# Patient Record
Sex: Male | Born: 1945 | Race: White | Hispanic: No | Marital: Married | Smoking: Never smoker
Health system: Southern US, Community
[De-identification: ages and names within clinical notes are randomized; demographics above are authoritative.]

## PROBLEM LIST (undated history)

## (undated) DIAGNOSIS — I10 Essential (primary) hypertension: Secondary | ICD-10-CM

## (undated) DIAGNOSIS — E785 Hyperlipidemia, unspecified: Secondary | ICD-10-CM

## (undated) DIAGNOSIS — M1990 Unspecified osteoarthritis, unspecified site: Secondary | ICD-10-CM

## (undated) DIAGNOSIS — G4730 Sleep apnea, unspecified: Secondary | ICD-10-CM

## (undated) DIAGNOSIS — J45909 Unspecified asthma, uncomplicated: Secondary | ICD-10-CM

## (undated) HISTORY — DX: Unspecified osteoarthritis, unspecified site: M19.90

## (undated) HISTORY — DX: Hyperlipidemia, unspecified: E78.5

## (undated) HISTORY — DX: Sleep apnea, unspecified: G47.30

## (undated) HISTORY — PX: THROAT SURGERY: SHX803

## (undated) HISTORY — DX: Unspecified asthma, uncomplicated: J45.909

## (undated) HISTORY — PX: HERNIA REPAIR: SHX51

## (undated) HISTORY — DX: Essential (primary) hypertension: I10

## (undated) HISTORY — PX: BICEPS TENDON REPAIR: SHX566

---

## 2005-11-19 HISTORY — PX: COLONOSCOPY: SHX174

## 2006-09-19 ENCOUNTER — Ambulatory Visit: Payer: Self-pay | Admitting: Internal Medicine

## 2006-09-26 ENCOUNTER — Ambulatory Visit: Payer: Self-pay | Admitting: Internal Medicine

## 2007-06-10 ENCOUNTER — Ambulatory Visit: Payer: Self-pay | Admitting: Vascular Surgery

## 2011-04-03 NOTE — Procedures (Signed)
CAROTID DUPLEX EXAM   INDICATION:  Syncope.   HISTORY:  Diabetes:  No.  Cardiac:  No.  Hypertension:  Yes.  Smoking:  No.  Previous Surgery:  No.  CV History:  Syncope in July.  Amaurosis Fugax No, Paresthesias No, Hemiparesis No                                       RIGHT             LEFT  Brachial systolic pressure:         160               150  Brachial Doppler waveforms:         Triphasic         Triphasic  Vertebral direction of flow:        Antegrade         Antegrade  DUPLEX VELOCITIES (cm/sec)  CCA peak systolic                   78                91  ECA peak systolic                   90                96  ICA peak systolic                   106               85  ICA end diastolic                   29                24  PLAQUE MORPHOLOGY:                  Mixed             Mixed  PLAQUE AMOUNT:                      Mild              Mild  PLAQUE LOCATION:                    ICA               ICA   IMPRESSION:  The right ICA shows evidence of 1-39% stenosis.  The left ICA shows evidence of 1-19% stenosis.   ___________________________________________  P. Liliane Bade, M.D.   AS/MEDQ  D:  06/10/2007  T:  06/10/2007  Job:  [id number]

## 2012-01-17 ENCOUNTER — Ambulatory Visit (INDEPENDENT_AMBULATORY_CARE_PROVIDER_SITE_OTHER): Payer: 59 | Admitting: Cardiovascular Disease

## 2012-01-17 ENCOUNTER — Encounter: Payer: Self-pay | Admitting: Cardiovascular Disease

## 2012-01-17 VITALS — BP 113/73 | HR 86 | Ht 74.0 in | Wt 197.0 lb

## 2012-01-17 DIAGNOSIS — I4949 Other premature depolarization: Secondary | ICD-10-CM

## 2012-01-17 DIAGNOSIS — M25512 Pain in left shoulder: Secondary | ICD-10-CM | POA: Insufficient documentation

## 2012-01-17 DIAGNOSIS — I493 Ventricular premature depolarization: Secondary | ICD-10-CM

## 2012-01-17 DIAGNOSIS — M25519 Pain in unspecified shoulder: Secondary | ICD-10-CM

## 2012-01-17 NOTE — Patient Instructions (Addendum)
Your physician has requested that you have an exercise tolerance test.. Please also follow instruction sheet, as given.    Your physician wants you to follow-up in: 1 year  You will receive a reminder letter in the mail two months in advance. If you don't receive a letter, please call our office to schedule the follow-up appointment.

## 2012-01-17 NOTE — Assessment & Plan Note (Signed)
Jordan Hicks presents with left shoulder and neck pain. These episodes have typically occurred in the middle of the night when he is lying in bed. They have never occurred with exertion. He denies any exertional shortness of breath, syncope, or presyncope.  He does have some premature ventricular contractions on EKG.  I suspect that he has some cervical disc disease.  I would suggest that he get an MRI of the neck for further evaluation. He may need to see a neurosurgeon or orthopedist if he is found to have significant cervical disc disease.    I think that it is reasonable to do a stress test to rule out coronary artery disease. He is at relatively low risk.

## 2012-01-17 NOTE — Assessment & Plan Note (Signed)
He has premature ventricular contractions noted on his EKG today. I've suggested that he be higher potassium diet.

## 2012-01-17 NOTE — Progress Notes (Signed)
    Teresita Madura Date of Birth  March 12, 1946 [facility]     [place] Office  [address]    [address]   [address] [place], [place]  [address]    [place], [place]  [address] [phone]  Fax  [phone]  [phone]  Fax [phone]  Problems: 1. Shoulder pain. 2. Premature Ventricular contractions  History of Present Illness:  Rupert is a 66 yo who presents today for evaluation of left shoulder pain.  Pain is not exertional. The pain has been going on for 3 days. It typically will occur in the middle of the night around 4 AM. He had a very bad episode last night and then again this morning.  He does not get any regular exercise. Will occasionally go out for walks. He's never had any episodes of chest pain or shortness breath while walking.  He has occasional episodes of orthostasis. He was seen at [facility] several years ago for an episode of orthostasis.  He had what sounds like an echocardiogram which was completely normal. He's not had any severe episodes since that time. He still has occasional mild episodes.   Current Outpatient Prescriptions  Medication Sig Dispense Refill  . aspirin 81 MG tablet Take 81 mg by mouth daily.      . Calcium Carbonate-Vitamin D (CALCIUM + D PO) Take by mouth daily.      Marland Kitchen lisinopril (PRINIVIL,ZESTRIL) 5 MG tablet Take 5 mg by mouth daily.      . Multiple Vitamins-Minerals (MULTIVITAMIN PO) Take by mouth. Taking BID a Week      . niacin 500 MG tablet Take 500 mg by mouth daily with breakfast.        No Known Allergies  Past Medical History  Diagnosis Date  . GERD (gastroesophageal reflux disease)   . Thyroid disease     hypo  . IBS (irritable bowel syndrome)   . DM (diabetes mellitus)     Type 2  . HTN (hypertension)     Past Surgical History  Procedure Date  . Hernia repair   . Throat surgery     About 10-36yrs ago    History  Smoking status  . Never Smoker   Smokeless tobacco  . Not on  file    History  Alcohol Use  . Yes    1 Bottle of Wine a Day   he works at [employer]. He mainly works at [employer].  History reviewed. No pertinent family history.  Reviw of Systems:  Reviewed in the HPI.  All other systems are negative.  Physical Exam: Blood pressure 113/73, pulse 86, weight 197 lb (89.359 kg). General: Well developed, well nourished, in no acute distress.  Head: Normocephalic, atraumatic, sclera non-icteric, mucus membranes are moist,   Neck: Supple. Carotids are 2 + without bruits. No JVD  Lungs: Clear   Heart: regular rate  With normal  S1 S2. No murmurs, gallops or rubs.  Abdomen: Soft, non-tender, non-distended with normal bowel sounds. No hepatomegaly. No rebound/guarding. No masses.  Msk:  Strength and tone are normal  Extremities: No clubbing or cyanosis. No edema.  Distal pedal pulses are 2+ and equal bilaterally.  Neuro: Alert and oriented X 3. Moves all extremities spontaneously.  Psych:  Responds to questions appropriately with a normal affect.  ECG: NSR. occasioinal PVCs.  No St or T wave changes  Assessment / Plan:

## 2012-01-18 ENCOUNTER — Institutional Professional Consult (permissible substitution): Payer: Self-pay | Admitting: Cardiology

## 2012-02-04 ENCOUNTER — Ambulatory Visit (INDEPENDENT_AMBULATORY_CARE_PROVIDER_SITE_OTHER): Payer: 59 | Admitting: Physician Assistant

## 2012-02-04 DIAGNOSIS — I493 Ventricular premature depolarization: Secondary | ICD-10-CM

## 2012-02-04 DIAGNOSIS — M25512 Pain in left shoulder: Secondary | ICD-10-CM

## 2012-02-04 DIAGNOSIS — R002 Palpitations: Secondary | ICD-10-CM

## 2012-02-04 DIAGNOSIS — M25519 Pain in unspecified shoulder: Secondary | ICD-10-CM

## 2012-02-04 NOTE — Progress Notes (Signed)
Exercise Treadmill Test  Pre-Exercise Testing Evaluation Rhythm: normal sinus  Rate: 87   PR:  .14 QRS:  .08  QT:  .37 QTc: .45           Test  Exercise Tolerance Test Ordering MD: Kristeen Miss, MD  Interpreting MD:  Tereso Newcomer, PA-C  Unique Test No: 1  Treadmill:  1  Indication for ETT: chest pain - rule out ischemia  Contraindication to ETT: No   Stress Modality: exercise - treadmill  Cardiac Imaging Performed: non   Protocol: standard Bruce - maximal  Max BP:  184/87  Max MPHR (bpm):  154 85% MPR (bpm):  131  MPHR obtained (bpm):  162 % MPHR obtained:  105%  Reached 85% MPHR (min:sec):  6:44 Total Exercise Time (min-sec):  10:00  Workload in METS:  11.7 Borg Scale: 15  Reason ETT Terminated:  desired heart rate attained    ST Segment Analysis At Rest: normal ST segments - no evidence of significant ST depression With Exercise: no evidence of significant ST depression  Other Information Arrhythmia:  PVCs noted;  one couplet and one triplet noted prior to test but no ventricular arrhythmia with exercise Angina during ETT:  absent (0) Quality of ETT:  diagnostic  ETT Interpretation:  normal - no evidence of ischemia by ST analysis  Comments: Good exercise tolerance. No chest pain. Normal BP response to exercise. No ST-T changes to suggest ischemia.  As noted, PVCs noted in pre-exercise and in recovery. Also, one couplet and one triplet noted prior to exercise.  Recommendations: Follow up with Dr. Delane Ginger as directed. Tereso Newcomer, PA-C  11:31 AM 02/04/2012

## 2012-02-06 ENCOUNTER — Telehealth: Payer: Self-pay | Admitting: *Deleted

## 2012-02-06 DIAGNOSIS — R002 Palpitations: Secondary | ICD-10-CM

## 2012-02-06 NOTE — Telephone Encounter (Signed)
Pt called and informed further testing needed to to palpitations, described echo, pt agreed to plan and is aware we will call him back with date and time.

## 2012-02-21 ENCOUNTER — Ambulatory Visit (HOSPITAL_COMMUNITY): Payer: 59 | Attending: Cardiovascular Disease

## 2012-02-21 ENCOUNTER — Other Ambulatory Visit: Payer: Self-pay

## 2012-02-21 DIAGNOSIS — I4949 Other premature depolarization: Secondary | ICD-10-CM | POA: Insufficient documentation

## 2012-02-21 DIAGNOSIS — I10 Essential (primary) hypertension: Secondary | ICD-10-CM | POA: Insufficient documentation

## 2012-02-21 DIAGNOSIS — R002 Palpitations: Secondary | ICD-10-CM

## 2012-02-21 DIAGNOSIS — E119 Type 2 diabetes mellitus without complications: Secondary | ICD-10-CM | POA: Insufficient documentation

## 2015-08-25 DIAGNOSIS — R69 Illness, unspecified: Secondary | ICD-10-CM | POA: Diagnosis not present

## 2015-12-06 DIAGNOSIS — I10 Essential (primary) hypertension: Secondary | ICD-10-CM | POA: Diagnosis not present

## 2015-12-06 DIAGNOSIS — E785 Hyperlipidemia, unspecified: Secondary | ICD-10-CM | POA: Diagnosis not present

## 2015-12-06 DIAGNOSIS — Z0000 Encounter for general adult medical examination without abnormal findings: Secondary | ICD-10-CM | POA: Diagnosis not present

## 2015-12-14 DIAGNOSIS — Z6825 Body mass index (BMI) 25.0-25.9, adult: Secondary | ICD-10-CM | POA: Diagnosis not present

## 2015-12-14 DIAGNOSIS — M25519 Pain in unspecified shoulder: Secondary | ICD-10-CM | POA: Diagnosis not present

## 2015-12-14 DIAGNOSIS — M7021 Olecranon bursitis, right elbow: Secondary | ICD-10-CM | POA: Diagnosis not present

## 2015-12-14 DIAGNOSIS — I10 Essential (primary) hypertension: Secondary | ICD-10-CM | POA: Diagnosis not present

## 2015-12-14 DIAGNOSIS — Z0000 Encounter for general adult medical examination without abnormal findings: Secondary | ICD-10-CM | POA: Diagnosis not present

## 2015-12-14 DIAGNOSIS — E784 Other hyperlipidemia: Secondary | ICD-10-CM | POA: Diagnosis not present

## 2015-12-14 DIAGNOSIS — J309 Allergic rhinitis, unspecified: Secondary | ICD-10-CM | POA: Diagnosis not present

## 2015-12-14 DIAGNOSIS — Z1389 Encounter for screening for other disorder: Secondary | ICD-10-CM | POA: Diagnosis not present

## 2016-01-04 DIAGNOSIS — L918 Other hypertrophic disorders of the skin: Secondary | ICD-10-CM | POA: Diagnosis not present

## 2016-01-04 DIAGNOSIS — L853 Xerosis cutis: Secondary | ICD-10-CM | POA: Diagnosis not present

## 2016-01-04 DIAGNOSIS — D225 Melanocytic nevi of trunk: Secondary | ICD-10-CM | POA: Diagnosis not present

## 2016-01-04 DIAGNOSIS — L821 Other seborrheic keratosis: Secondary | ICD-10-CM | POA: Diagnosis not present

## 2016-01-04 DIAGNOSIS — L814 Other melanin hyperpigmentation: Secondary | ICD-10-CM | POA: Diagnosis not present

## 2016-02-21 DIAGNOSIS — R69 Illness, unspecified: Secondary | ICD-10-CM | POA: Diagnosis not present

## 2016-02-22 DIAGNOSIS — R69 Illness, unspecified: Secondary | ICD-10-CM | POA: Diagnosis not present

## 2016-03-13 DIAGNOSIS — Z0000 Encounter for general adult medical examination without abnormal findings: Secondary | ICD-10-CM | POA: Diagnosis not present

## 2016-03-13 DIAGNOSIS — E7800 Pure hypercholesterolemia, unspecified: Secondary | ICD-10-CM | POA: Diagnosis not present

## 2016-03-13 DIAGNOSIS — I10 Essential (primary) hypertension: Secondary | ICD-10-CM | POA: Diagnosis not present

## 2016-05-01 DIAGNOSIS — D3709 Neoplasm of uncertain behavior of other specified sites of the oral cavity: Secondary | ICD-10-CM | POA: Diagnosis not present

## 2016-05-04 DIAGNOSIS — K1230 Oral mucositis (ulcerative), unspecified: Secondary | ICD-10-CM | POA: Diagnosis not present

## 2016-05-30 DIAGNOSIS — Z6825 Body mass index (BMI) 25.0-25.9, adult: Secondary | ICD-10-CM | POA: Diagnosis not present

## 2016-05-30 DIAGNOSIS — I10 Essential (primary) hypertension: Secondary | ICD-10-CM | POA: Diagnosis not present

## 2016-05-30 DIAGNOSIS — J301 Allergic rhinitis due to pollen: Secondary | ICD-10-CM | POA: Diagnosis not present

## 2016-05-30 DIAGNOSIS — M7021 Olecranon bursitis, right elbow: Secondary | ICD-10-CM | POA: Diagnosis not present

## 2016-05-30 DIAGNOSIS — E784 Other hyperlipidemia: Secondary | ICD-10-CM | POA: Diagnosis not present

## 2016-05-30 DIAGNOSIS — G250 Essential tremor: Secondary | ICD-10-CM | POA: Diagnosis not present

## 2016-06-15 DIAGNOSIS — H5203 Hypermetropia, bilateral: Secondary | ICD-10-CM | POA: Diagnosis not present

## 2016-06-15 DIAGNOSIS — H43813 Vitreous degeneration, bilateral: Secondary | ICD-10-CM | POA: Diagnosis not present

## 2016-06-15 DIAGNOSIS — H524 Presbyopia: Secondary | ICD-10-CM | POA: Diagnosis not present

## 2016-06-15 DIAGNOSIS — H2513 Age-related nuclear cataract, bilateral: Secondary | ICD-10-CM | POA: Diagnosis not present

## 2016-06-29 ENCOUNTER — Encounter: Payer: Self-pay | Admitting: Internal Medicine

## 2016-07-24 ENCOUNTER — Encounter: Payer: Self-pay | Admitting: Internal Medicine

## 2016-08-04 DIAGNOSIS — Z23 Encounter for immunization: Secondary | ICD-10-CM | POA: Diagnosis not present

## 2016-08-30 DIAGNOSIS — L570 Actinic keratosis: Secondary | ICD-10-CM | POA: Diagnosis not present

## 2016-09-13 DIAGNOSIS — R69 Illness, unspecified: Secondary | ICD-10-CM | POA: Diagnosis not present

## 2016-09-26 ENCOUNTER — Encounter: Payer: Self-pay | Admitting: Internal Medicine

## 2016-11-05 ENCOUNTER — Ambulatory Visit: Payer: Medicare HMO | Admitting: *Deleted

## 2016-11-05 VITALS — Ht 75.0 in | Wt 200.8 lb

## 2016-11-05 DIAGNOSIS — Z1211 Encounter for screening for malignant neoplasm of colon: Secondary | ICD-10-CM

## 2016-11-05 MED ORDER — NA SULFATE-K SULFATE-MG SULF 17.5-3.13-1.6 GM/177ML PO SOLN: 1.0000 | Freq: Once | ORAL | 0 refills | Status: AC

## 2016-11-05 NOTE — Progress Notes (Signed)
Denies allergies to eggs or soy products. Denies complications with sedation or anesthesia. Denies O2 use. Denies use of diet or weight loss medications.  Emmi instructions given for colonoscopy.  

## 2016-11-28 ENCOUNTER — Telehealth: Payer: Self-pay | Admitting: Internal Medicine

## 2016-11-28 NOTE — Telephone Encounter (Signed)
Returned patient's call and he states that he has had cough productive of yellow phlegm and sore throat x 3 days.  Patient states that it has progressively gotten worse over the 3 days. Pt. Denies chills or fever at this time.  He is having a colonoscopy tomorrow and was wanting to know if it would be ok to have it with his cold symptoms. I advised him that I would talk with Dr. Henrene Pastor and see what he advises and call him back with response.  Note routed to Dr. Henrene Pastor.

## 2016-11-28 NOTE — Telephone Encounter (Signed)
Returned patient's call and advised him that as long as he is feeling ok to come in and not running a temperature or symptoms do not worsen, he can proceed with procedure tomorrow.  I advise patient to call if symptoms do worsen and we would reschedule.  Patient agreed to do so.  All questions were answered.

## 2016-11-28 NOTE — Telephone Encounter (Signed)
Mostly common sense. If he feels well enough to proceed, then fine. However, if he would prefer rescheduling to another time, that would be okay certainly

## 2016-11-29 ENCOUNTER — Encounter: Payer: Self-pay | Admitting: Internal Medicine

## 2016-11-29 ENCOUNTER — Ambulatory Visit (AMBULATORY_SURGERY_CENTER): Payer: Medicare HMO | Admitting: Internal Medicine

## 2016-11-29 VITALS — BP 119/77 | HR 69 | Temp 98.7°F | Resp 18 | Ht 75.0 in | Wt 200.0 lb

## 2016-11-29 DIAGNOSIS — D122 Benign neoplasm of ascending colon: Secondary | ICD-10-CM | POA: Diagnosis not present

## 2016-11-29 DIAGNOSIS — Z1211 Encounter for screening for malignant neoplasm of colon: Secondary | ICD-10-CM | POA: Diagnosis not present

## 2016-11-29 DIAGNOSIS — Z1212 Encounter for screening for malignant neoplasm of rectum: Secondary | ICD-10-CM | POA: Diagnosis not present

## 2016-11-29 MED ORDER — SODIUM CHLORIDE 0.9 % IV SOLN: 500.0000 mL | INTRAVENOUS | Status: DC

## 2016-11-29 NOTE — Progress Notes (Signed)
Called to room to assist during endoscopic procedure.  Patient ID and intended procedure confirmed with present staff. Received instructions for my participation in the procedure from the performing physician.  

## 2016-11-29 NOTE — Op Note (Signed)
[place] Patient Name: Jordan Hicks Procedure Date: 11/29/2016 8:49 AM MRN: [id number] Endoscopist: Docia Chuck. Henrene Pastor , MD Age: 72 Referring MD:  Date of Birth: [birth year] Gender: Male Account #: [id number] Procedure:                Colonoscopy w/ cold snare polypectomy x 2 Indications:              Screening for colorectal malignant neoplasm. Prior                            negative exams 2002 and 2007 Medicines:                Monitored Anesthesia Care Procedure:                Pre-Anesthesia Assessment:                           - Prior to the procedure, a History and Physical                            was performed, and patient medications and                            allergies were reviewed. The patient's tolerance of                            previous anesthesia was also reviewed. The risks                            and benefits of the procedure and the sedation                            options and risks were discussed with the patient.                            All questions were answered, and informed consent                            was obtained. Prior Anticoagulants: The patient has                            taken no previous anticoagulant or antiplatelet                            agents. ASA Grade Assessment: II - A patient with                            mild systemic disease. After reviewing the risks                            and benefits, the patient was deemed in                            satisfactory condition to undergo the procedure.  After obtaining informed consent, the colonoscope                            was passed under direct vision. Throughout the                            procedure, the patient's blood pressure, pulse, and                            oxygen saturations were monitored continuously. The                            Colonoscope was introduced through the anus and   advanced to the the cecum, identified by                            appendiceal orifice and ileocecal valve. The                            ileocecal valve, appendiceal orifice, and rectum                            were photographed. The quality of the bowel                            preparation was excellent. The colonoscopy was                            performed without difficulty. The patient tolerated                            the procedure well. The bowel preparation used was                            SUPREP. Scope In: 9:12:37 AM Scope Out: 9:28:14 AM Scope Withdrawal Time: 0 hours 13 minutes 13 seconds  Total Procedure Duration: 0 hours 15 minutes 37 seconds  Findings:                 Two polyps were found in the ascending colon. The                            polyps were 1 to 5 mm in size. These polyps were                            removed with a cold snare. Resection and retrieval                            were complete.                           Multiple small and large-mouthed diverticula were                            found in the mid ascending colon  and left colon.                           Internal hemorrhoids were found during retroflexion.                           The exam was otherwise without abnormality on                            direct and retroflexion views. Complications:            No immediate complications. Estimated blood loss:                            None. Estimated Blood Loss:     Estimated blood loss: none. Impression:               - Two 1 to 5 mm polyps in the ascending colon,                            removed with a cold snare. Resected and retrieved.                           - Diverticulosis in the mid ascending colon and in                            the left colon.                           - Internal hemorrhoids.                           - The examination was otherwise normal on direct                            and retroflexion  views. Recommendation:           - Repeat colonoscopy in 5 years for surveillance.                           - Patient has a contact number available for                            emergencies. The signs and symptoms of potential                            delayed complications were discussed with the                            patient. Return to normal activities tomorrow.                            Written discharge instructions were provided to the                            patient.                           -  Resume previous diet.                           - Continue present medications.                           - Await pathology results. Docia Chuck. Henrene Pastor, MD 11/29/2016 9:34:03 AM This report has been signed electronically.

## 2016-11-29 NOTE — Patient Instructions (Signed)
    Handouts given: Polyps, Hemorrhoids, and Diverticulosis.  YOU HAD AN ENDOSCOPIC PROCEDURE TODAY AT [facility]:   Refer to the procedure report that was given to you for any specific questions about what was found during the examination.  If the procedure report does not answer your questions, please call your gastroenterologist to clarify.  If you requested that your care partner not be given the details of your procedure findings, then the procedure report has been included in a sealed envelope for you to review at your convenience later.  YOU SHOULD EXPECT: Some feelings of bloating in the abdomen. Passage of more gas than usual.  Walking can help get rid of the air that was put into your GI tract during the procedure and reduce the bloating. If you had a lower endoscopy (such as a colonoscopy or flexible sigmoidoscopy) you may notice spotting of blood in your stool or on the toilet paper. If you underwent a bowel prep for your procedure, you may not have a normal bowel movement for a few days.  Please Note:  You might notice some irritation and congestion in your nose or some drainage.  This is from the oxygen used during your procedure.  There is no need for concern and it should clear up in a day or so.  SYMPTOMS TO REPORT IMMEDIATELY:   Following lower endoscopy (colonoscopy or flexible sigmoidoscopy):  Excessive amounts of blood in the stool  Significant tenderness or worsening of abdominal pains  Swelling of the abdomen that is new, acute  Fever of 100F or higher  For urgent or emergent issues, a gastroenterologist can be reached at any hour by calling [phone].   DIET:  We do recommend a small meal at first, but then you may proceed to your regular diet.  Drink plenty of fluids but you should avoid alcoholic beverages for 24 hours.  ACTIVITY:  You should plan to take it easy for the rest of today and you should NOT DRIVE or use heavy machinery until  tomorrow (because of the sedation medicines used during the test).    FOLLOW UP: Our staff will call the number listed on your records the next business day following your procedure to check on you and address any questions or concerns that you may have regarding the information given to you following your procedure. If we do not reach you, we will leave a message.  However, if you are feeling well and you are not experiencing any problems, there is no need to return our call.  We will assume that you have returned to your regular daily activities without incident.  If any biopsies were taken you will be contacted by phone or by letter within the next 1-3 weeks.  Please call us at [phone] if you have not heard about the biopsies in 3 weeks.    SIGNATURES/CONFIDENTIALITY: You and/or your care partner have signed paperwork which will be entered into your electronic medical record.  These signatures attest to the fact that that the information above on your After Visit Summary has been reviewed and is understood.  Full responsibility of the confidentiality of this discharge information lies with you and/or your care-partner.

## 2016-11-29 NOTE — Progress Notes (Signed)
A/ox3 pleased with MAC, report to Karen RN 

## 2016-11-30 ENCOUNTER — Telehealth: Payer: Self-pay

## 2016-11-30 NOTE — Telephone Encounter (Signed)
  Follow up Call-  Call back number 11/29/2016  Post procedure Call Back phone  # [phone]  Permission to leave phone message Yes  Some recent data might be hidden     Patient questions:  Do you have a fever, pain , or abdominal swelling? No. Pain Score  0 *  Have you tolerated food without any problems? Yes.    Have you been able to return to your normal activities? Yes.    Do you have any questions about your discharge instructions: Diet   No. Medications  No. Follow up visit  No.  Do you have questions or concerns about your Care? No.  Actions: * If pain score is 4 or above: No action needed, pain <4.

## 2016-12-06 ENCOUNTER — Encounter: Payer: Self-pay | Admitting: Internal Medicine

## 2016-12-07 DIAGNOSIS — E784 Other hyperlipidemia: Secondary | ICD-10-CM | POA: Diagnosis not present

## 2016-12-07 DIAGNOSIS — R8290 Unspecified abnormal findings in urine: Secondary | ICD-10-CM | POA: Diagnosis not present

## 2016-12-07 DIAGNOSIS — I10 Essential (primary) hypertension: Secondary | ICD-10-CM | POA: Diagnosis not present

## 2016-12-07 DIAGNOSIS — Z125 Encounter for screening for malignant neoplasm of prostate: Secondary | ICD-10-CM | POA: Diagnosis not present

## 2016-12-11 DIAGNOSIS — Z6824 Body mass index (BMI) 24.0-24.9, adult: Secondary | ICD-10-CM | POA: Diagnosis not present

## 2016-12-11 DIAGNOSIS — K640 First degree hemorrhoids: Secondary | ICD-10-CM | POA: Diagnosis not present

## 2016-12-11 DIAGNOSIS — Z0000 Encounter for general adult medical examination without abnormal findings: Secondary | ICD-10-CM | POA: Diagnosis not present

## 2016-12-11 DIAGNOSIS — Z7982 Long term (current) use of aspirin: Secondary | ICD-10-CM | POA: Diagnosis not present

## 2016-12-11 DIAGNOSIS — E7800 Pure hypercholesterolemia, unspecified: Secondary | ICD-10-CM | POA: Diagnosis not present

## 2016-12-11 DIAGNOSIS — M545 Low back pain: Secondary | ICD-10-CM | POA: Diagnosis not present

## 2016-12-11 DIAGNOSIS — I10 Essential (primary) hypertension: Secondary | ICD-10-CM | POA: Diagnosis not present

## 2016-12-11 DIAGNOSIS — Z79899 Other long term (current) drug therapy: Secondary | ICD-10-CM | POA: Diagnosis not present

## 2016-12-11 DIAGNOSIS — R251 Tremor, unspecified: Secondary | ICD-10-CM | POA: Diagnosis not present

## 2016-12-12 DIAGNOSIS — E784 Other hyperlipidemia: Secondary | ICD-10-CM | POA: Diagnosis not present

## 2016-12-12 DIAGNOSIS — Z0000 Encounter for general adult medical examination without abnormal findings: Secondary | ICD-10-CM | POA: Diagnosis not present

## 2016-12-12 DIAGNOSIS — I10 Essential (primary) hypertension: Secondary | ICD-10-CM | POA: Diagnosis not present

## 2016-12-12 DIAGNOSIS — Z6825 Body mass index (BMI) 25.0-25.9, adult: Secondary | ICD-10-CM | POA: Diagnosis not present

## 2016-12-12 DIAGNOSIS — J309 Allergic rhinitis, unspecified: Secondary | ICD-10-CM | POA: Diagnosis not present

## 2016-12-12 DIAGNOSIS — Z1389 Encounter for screening for other disorder: Secondary | ICD-10-CM | POA: Diagnosis not present

## 2016-12-12 DIAGNOSIS — M7021 Olecranon bursitis, right elbow: Secondary | ICD-10-CM | POA: Diagnosis not present

## 2016-12-12 DIAGNOSIS — G250 Essential tremor: Secondary | ICD-10-CM | POA: Diagnosis not present

## 2016-12-26 DIAGNOSIS — Z1212 Encounter for screening for malignant neoplasm of rectum: Secondary | ICD-10-CM | POA: Diagnosis not present

## 2017-01-03 DIAGNOSIS — L738 Other specified follicular disorders: Secondary | ICD-10-CM | POA: Diagnosis not present

## 2017-01-03 DIAGNOSIS — L814 Other melanin hyperpigmentation: Secondary | ICD-10-CM | POA: Diagnosis not present

## 2017-01-03 DIAGNOSIS — D225 Melanocytic nevi of trunk: Secondary | ICD-10-CM | POA: Diagnosis not present

## 2017-01-03 DIAGNOSIS — D1801 Hemangioma of skin and subcutaneous tissue: Secondary | ICD-10-CM | POA: Diagnosis not present

## 2017-01-03 DIAGNOSIS — L308 Other specified dermatitis: Secondary | ICD-10-CM | POA: Diagnosis not present

## 2017-01-03 DIAGNOSIS — L570 Actinic keratosis: Secondary | ICD-10-CM | POA: Diagnosis not present

## 2017-01-03 DIAGNOSIS — D2262 Melanocytic nevi of left upper limb, including shoulder: Secondary | ICD-10-CM | POA: Diagnosis not present

## 2017-01-03 DIAGNOSIS — L821 Other seborrheic keratosis: Secondary | ICD-10-CM | POA: Diagnosis not present

## 2017-03-26 DIAGNOSIS — R69 Illness, unspecified: Secondary | ICD-10-CM | POA: Diagnosis not present

## 2017-04-24 DIAGNOSIS — D692 Other nonthrombocytopenic purpura: Secondary | ICD-10-CM | POA: Diagnosis not present

## 2017-07-03 DIAGNOSIS — L814 Other melanin hyperpigmentation: Secondary | ICD-10-CM | POA: Diagnosis not present

## 2017-07-03 DIAGNOSIS — D0359 Melanoma in situ of other part of trunk: Secondary | ICD-10-CM | POA: Diagnosis not present

## 2017-07-03 DIAGNOSIS — D1801 Hemangioma of skin and subcutaneous tissue: Secondary | ICD-10-CM | POA: Diagnosis not present

## 2017-07-03 DIAGNOSIS — L570 Actinic keratosis: Secondary | ICD-10-CM | POA: Diagnosis not present

## 2017-07-03 DIAGNOSIS — D225 Melanocytic nevi of trunk: Secondary | ICD-10-CM | POA: Diagnosis not present

## 2017-07-30 DIAGNOSIS — D0359 Melanoma in situ of other part of trunk: Secondary | ICD-10-CM | POA: Diagnosis not present

## 2017-08-12 DIAGNOSIS — G250 Essential tremor: Secondary | ICD-10-CM | POA: Diagnosis not present

## 2017-08-12 DIAGNOSIS — I10 Essential (primary) hypertension: Secondary | ICD-10-CM | POA: Diagnosis not present

## 2017-08-12 DIAGNOSIS — Z23 Encounter for immunization: Secondary | ICD-10-CM | POA: Diagnosis not present

## 2017-08-12 DIAGNOSIS — E784 Other hyperlipidemia: Secondary | ICD-10-CM | POA: Diagnosis not present

## 2017-08-12 DIAGNOSIS — Z6824 Body mass index (BMI) 24.0-24.9, adult: Secondary | ICD-10-CM | POA: Diagnosis not present

## 2017-08-12 DIAGNOSIS — M7021 Olecranon bursitis, right elbow: Secondary | ICD-10-CM | POA: Diagnosis not present

## 2017-09-02 DIAGNOSIS — H5203 Hypermetropia, bilateral: Secondary | ICD-10-CM | POA: Diagnosis not present

## 2017-09-02 DIAGNOSIS — H2513 Age-related nuclear cataract, bilateral: Secondary | ICD-10-CM | POA: Diagnosis not present

## 2017-09-02 DIAGNOSIS — H524 Presbyopia: Secondary | ICD-10-CM | POA: Diagnosis not present

## 2017-09-25 DIAGNOSIS — R69 Illness, unspecified: Secondary | ICD-10-CM | POA: Diagnosis not present

## 2017-10-21 DIAGNOSIS — H8112 Benign paroxysmal vertigo, left ear: Secondary | ICD-10-CM | POA: Diagnosis not present

## 2017-10-21 DIAGNOSIS — Z6824 Body mass index (BMI) 24.0-24.9, adult: Secondary | ICD-10-CM | POA: Diagnosis not present

## 2017-12-10 DIAGNOSIS — Z8249 Family history of ischemic heart disease and other diseases of the circulatory system: Secondary | ICD-10-CM | POA: Diagnosis not present

## 2017-12-10 DIAGNOSIS — J309 Allergic rhinitis, unspecified: Secondary | ICD-10-CM | POA: Diagnosis not present

## 2017-12-10 DIAGNOSIS — I10 Essential (primary) hypertension: Secondary | ICD-10-CM | POA: Diagnosis not present

## 2017-12-10 DIAGNOSIS — N529 Male erectile dysfunction, unspecified: Secondary | ICD-10-CM | POA: Diagnosis not present

## 2017-12-10 DIAGNOSIS — Z823 Family history of stroke: Secondary | ICD-10-CM | POA: Diagnosis not present

## 2017-12-10 DIAGNOSIS — Z7982 Long term (current) use of aspirin: Secondary | ICD-10-CM | POA: Diagnosis not present

## 2017-12-12 DIAGNOSIS — R69 Illness, unspecified: Secondary | ICD-10-CM | POA: Diagnosis not present

## 2017-12-13 DIAGNOSIS — H8112 Benign paroxysmal vertigo, left ear: Secondary | ICD-10-CM | POA: Diagnosis not present

## 2017-12-13 DIAGNOSIS — R42 Dizziness and giddiness: Secondary | ICD-10-CM | POA: Diagnosis not present

## 2017-12-13 DIAGNOSIS — Z6824 Body mass index (BMI) 24.0-24.9, adult: Secondary | ICD-10-CM | POA: Diagnosis not present

## 2017-12-17 ENCOUNTER — Encounter: Payer: Self-pay | Admitting: Physical Therapy

## 2017-12-17 ENCOUNTER — Other Ambulatory Visit: Payer: Self-pay

## 2017-12-17 ENCOUNTER — Ambulatory Visit: Payer: Medicare HMO | Attending: Family Medicine | Admitting: Physical Therapy

## 2017-12-17 DIAGNOSIS — H8112 Benign paroxysmal vertigo, left ear: Secondary | ICD-10-CM | POA: Insufficient documentation

## 2017-12-17 DIAGNOSIS — R262 Difficulty in walking, not elsewhere classified: Secondary | ICD-10-CM | POA: Diagnosis not present

## 2017-12-17 DIAGNOSIS — R2681 Unsteadiness on feet: Secondary | ICD-10-CM | POA: Diagnosis not present

## 2017-12-18 NOTE — Therapy (Signed)
[place] [address] [place], [place], [address] Phone: [phone]   Fax:  [phone]  Physical Therapy Evaluation  Patient Details  Name: Jordan Hicks MRN: [id number] Date of Birth: 21-Jul-1946 Referring Provider: Geoffery Lyons NP   Encounter Date: 12/17/2017  PT End of Session - 12/18/17 0535    Visit Number  1    Number of Visits  4   Date for PT Re-Evaluation  01/17/18    Authorization Type  [facility]    Authorization Time Period  12/17/17 to 02/13/18    PT Start Time  1532    PT Stop Time  1608    PT Time Calculation (min)  36 min    Activity Tolerance  Patient tolerated treatment well    Behavior During Therapy  [facility] for tasks assessed/performed       Past Medical History:  Diagnosis Date  . Arthritis    lower back  . Asthma    childhood  . HTN (hypertension)   . Sleep apnea    as stated by wife    Past Surgical History:  Procedure Laterality Date  . BICEPS TENDON REPAIR Left   . COLONOSCOPY  2007  . HERNIA REPAIR    . THROAT SURGERY     About 10-35yrs ago    There were no vitals filed for this visit.   Subjective Assessment - 12/17/17 1536    Subjective  Sometimes it feels like I'm in a bit of a fog. Not thinking straight. I feel spinning for 2-5 seconds when I lie down flat and at times when I sit up from lying down. Describes the Epley maneuver the NP gave him to do (sounds like he has been doing it correctly) however spinning has not resolved for over 10 weeks.     Patient Stated Goals  Stop feeling dizzy    Currently in Pain?  No/denies         [facility] PT Assessment - 12/17/17 1647      Assessment   Medical Diagnosis  lt BPPV    Referring Provider  Geoffery Lyons NP    Onset Date/Surgical Date  10/08/17 approximately    Prior Therapy  none for vertigo      Precautions   Precautions  Fall    Precaution Comments  reports slight sense of imbalance      Restrictions   Weight Bearing Restrictions  No      Balance Screen   Has the patient fallen in the past 6 months  No    Has the patient had a decrease in activity level because of a fear of falling?   No    Is the patient reluctant to leave their home because of a fear of falling?   No      Prior Function   Level of Independence  Independent    Vocation  Retired      Observation/Other Assessments   Focus on Therapeutic Outcomes (FOTO)   NT      Posture/Postural Control   Posture/Postural Control  No significant limitations      Ambulation/Gait   Ambulation/Gait  Yes    Ambulation/Gait Assistance  6: Modified independent (Device/Increase time)    Ambulation Distance (Feet)  400 Feet    Assistive device  None    Gait Pattern  Step-through pattern drifts  left and right with head turns    Ambulation Surface  Indoor    Gait  Comments  after repositioinng gait assessed with +drift with head turns         Vestibular Assessment - 12/17/17 0001      Symptom Behavior   Type of Dizziness  Spinning    Frequency of Dizziness  daily    Duration of Dizziness  seconds    Aggravating Factors  Lying supine;Looking up to the ceiling    Relieving Factors  Slow movements      Occulomotor Exam   Occulomotor Alignment  Normal    Spontaneous  Absent    Gaze-induced  Left beating nystagmus with L gaze 3-4 beats on initial testing; not reproducible    Smooth Pursuits  Intact      Vestibulo-Occular Reflex   VOR 1 Head Only (x 1 viewing)  normal x 30 sec    VOR Cancellation  Normal    Comment  HIT negative bil       Positional Testing   Dix-Hallpike  Dix-Hallpike Right;Dix-Hallpike Left    Horizontal Canal Testing  Horizontal Canal Right;Horizontal Canal Left      Dix-Hallpike Right   Dix-Hallpike Right Duration  0    Dix-Hallpike Right Symptoms  No nystagmus      Dix-Hallpike Left   Dix-Hallpike Left Duration  2 seconds    Dix-Hallpike Left Symptoms  Upbeat, left rotatory nystagmus       Horizontal Canal Right   Horizontal Canal Right Duration  0    Horizontal Canal Right Symptoms  Normal      Horizontal Canal Left   Horizontal Canal Left Duration  0    Horizontal Canal Left Symptoms  Normal      Cognition   Cognition Orientation Level  Oriented x 4         Objective measurements completed on examination: See above findings.              PT Education - 12/18/17 0534    Education provided  Yes    Education Details  mechanism of BPPV and purpose of cannalith repositioning    Person(s) Educated  Patient    Methods  Explanation    Comprehension  Verbalized understanding          PT Long Term Goals - 12/18/17 0544      PT LONG TERM GOAL #1   Title  Patient will have negative positional testing for BPPV (Target 01/16/18)    Time  4    Period  Weeks    Status  New    Target Date  01/16/18      PT LONG TERM GOAL #2   Title  Patient will ambulate with head turns in all directions with <6 inch sway off path.     Time  4    Period  Weeks    Status  New             Plan - 12/18/17 0536    Clinical Impression Statement  Patient referred to PT for evaluation of vertigo lasting ~ 10 weeks despite NP performing cannalith repositioning, treating pt to do cannalith repositioing, use of meclizine, and hydration. Pt with +left Hallpike-Dix and responded to 3 rounds of Epley maneuver with no symptoms or nystagmus on 3rd cycle. No signs of hypofunction present at this time. Anticipate patient is cleared of BPPV, however will schedule one additional appointment for further repositioning (if needed) due to refractory nature of his BPPV.     Clinical Presentation  Stable    Clinical Decision  Making  Low    Rehab Potential  Excellent    PT Frequency  1x / week    PT Duration  4 weeks    PT Treatment/Interventions  ADLs/Self Care Home Management;Canalith Repostioning;Gait training;Therapeutic activities;Therapeutic exercise;Balance training;Neuromuscular  re-education;Patient/family education;Vestibular    PT Next Visit Plan  reassess for lt pBPPV and treat    Consulted and Agree with Plan of Care  Patient       Patient will benefit from skilled therapeutic intervention in order to improve the following deficits and impairments:  Decreased activity tolerance, Decreased balance, Dizziness, Impaired vision/preception  Visit Diagnosis: BPPV (benign paroxysmal positional vertigo), left - Plan: PT plan of care cert/re-cert  Unsteadiness on feet - Plan: PT plan of care cert/re-cert  Difficulty in walking, not elsewhere classified - Plan: PT plan of care cert/re-cert     Problem List Patient Active Problem List   Diagnosis Date Noted  . Left shoulder pain 01/17/2012  . Premature ventricular contractions 01/17/2012    Rexanne Mano, PT 12/18/2017, 5:52 AM  [facility] [address] [place], [place], [address] Phone: [phone]   Fax:  [phone]  Name: Jordan Hicks MRN: [id number] Date of Birth: 1945-12-14

## 2017-12-27 ENCOUNTER — Encounter: Payer: Self-pay | Admitting: Physical Therapy

## 2017-12-27 ENCOUNTER — Ambulatory Visit: Payer: Medicare HMO | Attending: Family Medicine | Admitting: Physical Therapy

## 2017-12-27 DIAGNOSIS — H8112 Benign paroxysmal vertigo, left ear: Secondary | ICD-10-CM | POA: Insufficient documentation

## 2017-12-27 DIAGNOSIS — R2681 Unsteadiness on feet: Secondary | ICD-10-CM | POA: Insufficient documentation

## 2017-12-27 NOTE — Therapy (Signed)
[place] [facility] [address] [place], [place], [address] Phone: [phone]   Fax:  [phone]  Physical Therapy Treatment  Patient Details  Name: Jordan Hicks MRN: [id number] Date of Birth: September 13, 1946 Referring Provider: Geoffery Lyons NP   Encounter Date: 12/27/2017  PT End of Session - 12/27/17 1856    Visit Number  2    Number of Visits  5 corrected to match certification of 1x/wk x 4 weeks    Date for PT Re-Evaluation  01/17/18    Authorization Type  [facility]    Authorization Time Period  12/17/17 to 02/13/18    PT Start Time  0803    PT Stop Time  0826    PT Time Calculation (min)  23 min    Activity Tolerance  Patient tolerated treatment well    Behavior During Therapy  [facility] for tasks assessed/performed       Past Medical History:  Diagnosis Date  . Arthritis    lower back  . Asthma    childhood  . HTN (hypertension)   . Sleep apnea    as stated by wife    Past Surgical History:  Procedure Laterality Date  . BICEPS TENDON REPAIR Left   . COLONOSCOPY  2007  . HERNIA REPAIR    . THROAT SURGERY     About 10-20yrs ago    There were no vitals filed for this visit.  Subjective Assessment - 12/27/17 0804    Subjective  Was good for about 5 days. Wasn't completely gone but significantly less. Then laid down on massage table yesterday and spinning again. Not the severity he had earlier.     Patient Stated Goals  Stop feeling dizzy    Currently in Pain?  No/denies             Vestibular Assessment - 12/27/17 0838      Positional Testing   Sidelying Test  Sidelying Right      Dix-Hallpike Left   Dix-Hallpike Left Duration  10    Dix-Hallpike Left Symptoms  Upbeat, left rotatory nystagmus      Sidelying Right   Sidelying Right Duration  0    Sidelying Right Symptoms  No nystagmus              [facility] Adult PT Treatment/Exercise - 12/27/17 0828      Bed Mobility   Bed Mobility   Rolling Right;Rolling Left;Right Sidelying to Sit;Sit to Supine    Rolling Right  7: Independent no symptoms with any bed mobility after Epley x 2    Rolling Left  7: Independent    Right Sidelying to Sit  7: Independent    Sit to Supine  7: Independent      Transfers   Transfers  Sit to Stand;Stand to Sit    Sit to Stand  7: Independent no symptoms      Ambulation/Gait   Ambulation/Gait  Yes    Ambulation/Gait Assistance  7: Independent    Ambulation Distance (Feet)  200 Feet    Assistive device  None    Gait Pattern  Within Functional Limits    Ambulation Surface  Indoor    Stairs  Yes    Stairs Assistance  7: Independent    Stair Management Technique  No rails;Alternating pattern;Forwards    Number of Stairs  4    Height of Stairs  6    Gait Comments  with head turns asymptomatic; no  symptoms going down stairs      Vestibular Treatment/Exercise - 12/27/17 0001      Vestibular Treatment/Exercise   Vestibular Treatment Provided  Canalith Repositioning    Canalith Repositioning  Epley Manuever Left       EPLEY MANUEVER LEFT   Number of Reps   2    Overall Response   Symptoms Resolved     RESPONSE DETAILS LEFT  +symptoms x 2 steps; no symptoms on 2nd complete rep            PT Education - 12/27/17 0831    Education provided  Yes    Education Details  hydration can help; Brandt-Daroff and rationale; keep case open x 60 days if recurrence    Person(s) Educated  Patient    Methods  Explanation;Demonstration;Handout    Comprehension  Verbalized understanding;Returned demonstration          PT Long Term Goals - 12/18/17 0544      PT LONG TERM GOAL #1   Title  Patient will have negative positional testing for BPPV (Target 01/16/18)    Time  4    Period  Weeks    Status  New    Target Date  01/16/18      PT LONG TERM GOAL #2   Title  Patient will ambulate with head turns in all directions with <6 inch sway off path.     Time  4    Period  Weeks    Status   New            Plan - 12/27/17 7824    Clinical Impression Statement  Patient with recurrence of symptoms when getting massage 12/26/17. Again +Hallpike-Dix on lt; negative sidelying test on right. Symptoms cleared and education completed. Will keep case open x 60 days in case of recurrence.     Rehab Potential  Excellent    PT Frequency  1x / week    PT Duration  4 weeks    PT Treatment/Interventions  ADLs/Self Care Home Management;Canalith Repostioning;Gait training;Therapeutic activities;Therapeutic exercise;Balance training;Neuromuscular re-education;Patient/family education;Vestibular    PT Next Visit Plan  reassess for lt pBPPV and treat; ? recert to cover visit    Consulted and Agree with Plan of Care  Patient       Patient will benefit from skilled therapeutic intervention in order to improve the following deficits and impairments:  Decreased activity tolerance, Decreased balance, Dizziness, Impaired vision/preception  Visit Diagnosis: BPPV (benign paroxysmal positional vertigo), left     Problem List Patient Active Problem List   Diagnosis Date Noted  . Left shoulder pain 01/17/2012  . Premature ventricular contractions 01/17/2012    Rexanne Mano, PT 12/27/2017, 8:38 AM  [facility] [address] [place], [place], [address] Phone: [phone]   Fax:  [phone]  Name: ASHELY JOSHUA MRN: [id number] Date of Birth: 10-08-1946

## 2017-12-27 NOTE — Patient Instructions (Signed)
Provided with Brandt-Daroff exercises; instructed to do for up to 2 days and if remains asymptomatic, can discontinue

## 2018-01-03 ENCOUNTER — Ambulatory Visit: Payer: Medicare HMO | Admitting: Physical Therapy

## 2018-01-03 ENCOUNTER — Encounter: Payer: Self-pay | Admitting: Physical Therapy

## 2018-01-03 VITALS — BP 118/69 | HR 76

## 2018-01-03 DIAGNOSIS — H8112 Benign paroxysmal vertigo, left ear: Secondary | ICD-10-CM | POA: Diagnosis not present

## 2018-01-03 DIAGNOSIS — R2681 Unsteadiness on feet: Secondary | ICD-10-CM | POA: Diagnosis not present

## 2018-01-03 NOTE — Therapy (Signed)
[place] [address] [place], [place], [address] Phone: [phone]   Fax:  [phone]  Physical Therapy Treatment  Patient Details  Name: Jordan Hicks MRN: [id number] Date of Birth: [birth year] Referring Provider: Geoffery Lyons NP   Encounter Date: 01/03/2018  PT End of Session - 01/03/18 0919    Visit Number  3    Number of Visits  5    Date for PT Re-Evaluation  01/17/18    Authorization Type  [facility] [facility]    Authorization Time Period  12/17/17 to 02/13/18    PT Start Time  0845    PT Stop Time  0900 session ended early - no vertigo today    PT Time Calculation (min)  15 min       Past Medical History:  Diagnosis Date  . Arthritis    lower back  . Asthma    childhood  . HTN (hypertension)   . Sleep apnea    as stated by wife    Past Surgical History:  Procedure Laterality Date  . BICEPS TENDON REPAIR Left   . COLONOSCOPY  2007  . HERNIA REPAIR    . THROAT SURGERY     About 10-62yrs ago    Vitals:   01/03/18 0853  BP: 118/69  Pulse: 76    Subjective Assessment - 01/03/18 0903    Subjective  Pt states he has not had vertigo for past 3 days; states he felt good last weekend after treatment on Friday (2-8) and then states vertigo returned the first part of the week for aobut 2-3 days; states he feels pretty good today; states he quit doing the exercises (sit to sidelying) because they made him so dizzy ; says he started to cancel today but wanted to confirm that the vertigo was resolved; has appt with Dr. Jaynee Eagles on Monday                                                                                                                 Patient Stated Goals  Stop feeling dizzy    Currently in Pain?  No/denies         NeuroRe-ed;   Positional testing for BPPV  Pt had (-) Rt and Lt Dix-Hallpike test with no nystagmus and no c/o vertigo in test position  (-) Rt and Lt sidelying tests with no  nystagmus and no c/o vertigo in test positions  Pt transferred sit to supine with 2 small pillows used with no c/o vertigo today - pt states this position Has provoked vertigo in the past  Pt requested to schedule appt with primary PT next Friday to discuss status after appt with Dr. Jaynee Eagles on 01-06-18    BP recorded - 118/69 after positional testing - pt had no c/o vertigo                  PT Education - 01/03/18 0927    Education provided  Yes    Education Details  reitterated benefit of hydration; discussed etiology of BPPV    Person(s) Educated  Patient    Methods  Explanation    Comprehension  Verbalized understanding          PT Long Term Goals - 12/18/17 0544      PT LONG TERM GOAL #1   Title  Patient will have negative positional testing for BPPV (Target 01/16/18)    Time  4    Period  Weeks    Status  New    Target Date  01/16/18      PT LONG TERM GOAL #2   Title  Patient will ambulate with head turns in all directions with <6 inch sway off path.     Time  4    Period  Weeks    Status  New            Plan - 01/03/18 7026    Clinical Impression Statement  Pt had no signs or symptoms of BPPV or any vertigo with any positional testing at this time. He has neurololgy appt on Monday, 01-06-18 with Dr. Jaynee Eagles; BPPV appears to be resolved at this time    Rehab Potential  Excellent    PT Frequency  1x / week    PT Duration  4 weeks    PT Treatment/Interventions  ADLs/Self Care Home Management;Canalith Repostioning;Gait training;Therapeutic activities;Therapeutic exercise;Balance training;Neuromuscular re-education;Patient/family education;Vestibular    PT Next Visit Plan  reassess for lt BPPV and treat; ? recert to cover visit    Consulted and Agree with Plan of Care  Patient       Patient will benefit from skilled therapeutic intervention in order to improve the following deficits and impairments:  Decreased activity tolerance, Decreased balance,  Dizziness, Impaired vision/preception  Visit Diagnosis: BPPV (benign paroxysmal positional vertigo), left     Problem List Patient Active Problem List   Diagnosis Date Noted  . Left shoulder pain 01/17/2012  . Premature ventricular contractions 01/17/2012    Louiza Moor, [place], PT 01/03/2018, 9:30 AM  [facility] [address] [place], [place], [address] Phone: [phone]   Fax:  [phone]  Name: DEAARON FULGHUM MRN: [id number] Date of Birth: 1945/12/30

## 2018-01-06 ENCOUNTER — Ambulatory Visit: Payer: Medicare HMO | Admitting: Neurology

## 2018-01-06 ENCOUNTER — Encounter: Payer: Self-pay | Admitting: Neurology

## 2018-01-06 VITALS — Ht 75.0 in | Wt 191.0 lb

## 2018-01-06 DIAGNOSIS — A881 Epidemic vertigo: Secondary | ICD-10-CM

## 2018-01-06 DIAGNOSIS — H905 Unspecified sensorineural hearing loss: Secondary | ICD-10-CM | POA: Diagnosis not present

## 2018-01-06 DIAGNOSIS — R42 Dizziness and giddiness: Secondary | ICD-10-CM | POA: Diagnosis not present

## 2018-01-06 DIAGNOSIS — H9190 Unspecified hearing loss, unspecified ear: Secondary | ICD-10-CM

## 2018-01-06 MED ORDER — METHYLPREDNISOLONE 4 MG PO TBPK: ORAL_TABLET | ORAL | 1 refills | Status: DC

## 2018-01-06 NOTE — Progress Notes (Addendum)
[id number]    Provider:  Dr Jaynee Eagles Referring Provider: Burnard Bunting, MD Primary Care Physician:  Burnard Bunting, MD  CC:  Vertigo.   HPI:  Jordan Hicks is a 72 y.o. male here as a referral from Dr. Reynaldo Minium for vertigo.  Past medical history arthritis, asthma, hypertension, sleep apnea(per wife),  migraine. He is here for vertigo. He had been doing well in vestibular therapy. His episodes of dizziness, more when he stands up, has improved over recent weeks. Also when he looks down. Also happens when he lays down and looks up at the ceiling. He is symptomatic with the Epley maneuvers and stopped doing them. Vertigo started in November, no inciting events, nothing out of the ordinary, no head trauma, never experienced vertigo before. Episodes last a few minutes. Overall he is improving. Don;t wake up with headaches, no dry mouth in the morning, feel well rested. He drinks a lot wine, 1-1.5 bottles a day denies loss of memory, just foggy. No other focal neurologic deficits, associated symptoms, inciting events or modifiable factors.  Reviewed notes, labs and imaging from outside physicians, which showed:  Reviewed referring physician notes.  Patient has been having dizziness and vertigo.  Vestibular rehab is helping but the dizziness returns.  Patient says that he feels as though he is in a fog.  Not thinking straight.  He feels spinning for 2-5 seconds when he lies down flat and at times when he sits up from lying down.  Appears to be doing the Epley maneuver, spinning has not resolved for over 10 weeks.  Reports slight sense of imbalance.  The spinning is daily, last seconds, left beating nystagmus with left gaze.  He is use meclizine.  Positive left Dix-Hallpike and responded to 3 rounds of Epley maneuver with no symptoms and no nystagmus on the third cycle. Neuro exam unremarkable.   Reviewed lab work, BMP with BUN 10 and creatinine 0.9. 12/13/2017  Review of  Systems: Patient complains of symptoms per HPI as well as the following symptoms: Blurred vision, double vision, shortness of breath, snoring, constipation, allergies. Pertinent negatives and positives per HPI. All others negative.   Social History   Socioeconomic History  . Marital status: Married    Spouse name: Not on file  . Number of children: 1  . Years of education: Not on file  . Highest education level: Bachelor's degree (e.g., BA, AB, BS)  Social Needs  . Financial resource strain: Not on file  . Food insecurity - worry: Not on file  . Food insecurity - inability: Not on file  . Transportation needs - medical: Not on file  . Transportation needs - non-medical: Not on file  Occupational History  . Not on file  Tobacco Use  . Smoking status: Never Smoker  . Smokeless tobacco: Never Used  Substance and Sexual Activity  . Alcohol use: Yes    Comment: 1 Bottle of Wine a Day  . Drug use: No  . Sexual activity: Not on file  Other Topics Concern  . Not on file  Social History Narrative   Lives at home with his wife   Right handed   Drinks 3 cups of caffeine per week    Family History  Problem Relation Age of Onset  . Emphysema Mother   . Heart attack Father   . Colon cancer Neg Hx     Past Medical History:  Diagnosis Date  . Arthritis    lower back  . Asthma  childhood  . HTN (hypertension)   . Hyperlipidemia   . Sleep apnea    as stated by wife    Past Surgical History:  Procedure Laterality Date  . BICEPS TENDON REPAIR Left   . COLONOSCOPY  2007  . HERNIA REPAIR    . THROAT SURGERY     About 10-64yrs ago    Current Outpatient Medications  Medication Sig Dispense Refill  . aspirin 81 MG tablet Take 81 mg by mouth daily.    . DHA-EPA-Vitamin E (OMEGA-3 COMPLEX PO) Take by mouth 2 (two) times daily.    . Multiple Vitamins-Minerals (MULTIVITAMIN PO) Take by mouth. Taking about 4 times weekly    . niacin 500 MG tablet Take 500 mg by mouth daily  with breakfast.    . NON FORMULARY 2 tablets daily. Cellular vitality complex     . VALSARTAN PO Take by mouth. Per pt equivalent of Losartan 50 mg. Had to change due to recall on Losartan.    . methylPREDNISolone (MEDROL DOSEPAK) 4 MG TBPK tablet follow package directions 21 tablet 1   Current Facility-Administered Medications  Medication Dose Route Frequency Provider Last Rate Last Dose  . 0.9 %  sodium chloride infusion  500 mL Intravenous Continuous Irene Shipper, MD        Allergies as of 01/06/2018  . (No Known Allergies)    Vitals: Ht 6\' 3"  (1.905 m)   Wt 191 lb (86.6 kg)   BMI 23.87 kg/m  Last Weight:  Wt Readings from Last 1 Encounters:  01/06/18 191 lb (86.6 kg)   Last Height:   Ht Readings from Last 1 Encounters:  01/06/18 6\' 3"  (1.905 m)   Physical exam: Exam: Gen: NAD, conversant, well nourised, obese, well groomed                     CV: RRR, no MRG. No Carotid Bruits. No peripheral edema, warm, nontender Eyes: Conjunctivae clear without exudates or hemorrhage  Neuro: Detailed Neurologic Exam  Speech:    Speech is normal; fluent and spontaneous with normal comprehension.  Cognition:    The patient is oriented to person, place, and time;     recent and remote memory intact;     language fluent;     normal attention, concentration,     fund of knowledge Cranial Nerves:    The pupils are equal, round, and reactive to light. The fundi are normal and spontaneous venous pulsations are present. Visual fields are full to finger confrontation. End-gaze nystagmus on left. Extraocular movements are intact. Trigeminal sensation is intact and the muscles of mastication are normal. The face is symmetric. The palate elevates in the midline. Hearing intact. Voice is normal. Shoulder shrug is normal. The tongue has normal motion without fasciculations.   Coordination:    Normal finger to nose and heel to shin. Normal rapid alternating movements.   Gait:    Heel-toe and  tandem gait are normal.   Motor Observation:    No asymmetry, no atrophy, and no involuntary movements noted. Tone:    Normal muscle tone.    Posture:    Posture is normal. normal erect    Strength:    Strength is V/V in the upper and lower limbs.      Sensation: intact to LT     Reflex Exam:  DTR's:    Deep tendon reflexes in the upper and lower extremities are normal bilaterally.   Toes:    The  toes are downgoing bilaterally.   Clonus:    Clonus is absent.       Assessment/Plan:  72 year old with likely BPPV. Ongoing several months, 10 weeks of vestibular therapy still symptomatic. Need MRI brain, referral to ENT and will order steroids just in case labyrinthitis.  - Per PT notes,  Positive left Dix-Hallpike and responded to 3 rounds of Epley maneuver with no symptoms and no nystagmus on the third cycle.  - BPPV still persistent after 10 weeks, having vestibular therapy  - Wife has stated she thinks he has sleep apnea, he denies memory loss, he feels well rested. Discuss with wife and if apneic spells needs sleep eval.  - 1-1.5 bottles of wine, discussed sequelae of this much alcohol, discuss with pcp (do not stop abruptly)  - ENT referral, Dr. Ernesto Rutherford   - MRI brain w/wo contrast to evaluate for schwannoma or other etiology of vertigo and hearing changes  - fall precautions  - continue vestibular therapy  Orders Placed This Encounter  Procedures  . MR BRAIN W WO CONTRAST  . Basic Metabolic Panel  . Ambulatory referral to ENT      Sarina Ill, MD  [facility] [address] [place], [place] [address]  Phone [phone] Fax [phone]

## 2018-01-06 NOTE — Patient Instructions (Addendum)
MRI of the brain with contrast Steroids for 6 days ENT referral Dr. Ernesto Rutherford   Labyrinthitis Labyrinthitis is an infection of the inner ear. Your inner ear is a fluid-filled system of tubes and canals (labyrinth). Nerve cells in your inner ear send signals for hearing and balance to your brain. When tiny germs (microorganisms) get inside the labyrinth, they harm the cells that send messages to the brain. Labyrinthitis can cause changes in hearing and balance. Most cases of labyrinthitis come on suddenly and they clear up within weeks. If the infection damages parts of the labyrinth, some symptoms may remain (chronic labyrinthitis). What are the causes? Viruses are the most common cause of labyrinthitis. Viruses that spread into the labyrinth are the same viruses that cause other diseases, such as:  Mononucleosis.  Measles.  Flu.  Herpes.  Bacteria can also cause labyrinthitis when they spread into the labyrinth from an infection in the brain or the middle ear. Bacteria can cause:  Serous labyrinthitis. This type of labyrinthitis develops when bacteria produce a poison (toxin) that gets inside the labyrinth.  Suppurative labyrinthitis. This type of labyrinthitis develops when bacteria get inside the labyrinth.  What increases the risk? You may be at greater risk for labyrinthitis if you:  Drink a lot of alcohol.  Smoke.  Take certain drugs.  Are not well rested (fatigued).  Are under a lot of stress.  Have allergies.  Recently had a nose or throat infection (upper respiratory infection) or an ear infection.  What are the signs or symptoms? Symptoms of labyrinthitis usually start suddenly. The symptoms can be mild or strong and may include:  Dizziness.  Hearing loss.  A feeling that you are moving when you are not (vertigo).  Ringing in the ear (tinnitus).  Nausea and vomiting.  Trouble focusing your eyes.  Symptoms of chronic labyrinthitis may  include:  Fatigue.  Confusion.  Hearing loss.  Tinnitus.  Poor balance.  Vertigo after sudden head movements.  How is this diagnosed? Your health care provider may suspect labyrinthitis if you suddenly get dizzy and lose hearing, especially if you had a recent upper respiratory infection. Your health care provider will perform a physical exam to:  Check your ears for infection.  Test your balance.  Check your eye movement.  Your health care provider may do several tests to rule out other causes of your symptoms and to help make a diagnosis of labyrinthitis. These may include:  Imaging studies, such as a CT scan or an MRI, to look for other causes of your symptoms.  Hearing tests.  Electronystagmography (ENG) to check your balance.  How is this treated? Treatment of labyrinthitis depends on the cause. If your labyrinthitis is caused by a virus, it may get better without treatment. If your labyrinthitis is caused by bacteria, you may need medicine to fight the infection (antibiotic medicine). You may also have treatment to relieve labyrinthitis symptoms. Treatments may include:  Medicines to: ? Stop dizziness. ? Relieve nausea. ? Treat the inflamed area. ? Speed up your recovery.  Bed rest until dizziness goes away.  Fluids given through an IV tube. You may need this treatment if you have too little fluid in your body (dehydrated) from repeated nausea and vomiting.  Follow these instructions at home:  Take medicines only as directed by your health care provider.  If you were prescribed an antibiotic medicine, finish all of it even if you start to feel better.  Rest as much as possible.  Avoid loud noises and bright lights.  Do not make sudden movements until any dizziness goes away.  Do not drive until your health care provider says that you can.  Drink enough fluid to keep your urine clear or pale yellow.  Work with a physical therapist if you still feel  dizzy after several weeks. A therapist can teach you exercises to help you adjust to feeling dizzy (vestibular rehabilitation exercises).  Keep all follow-up visits as directed by your health care provider. This is important. Contact a health care provider if:  Your symptoms are not relieved by medicines.  Your symptoms last longer than two weeks.  You have a fever. Get help right away if:  You become very dizzy.  You have nausea or vomiting that does not go away.  Your hearing gets much worse very quickly. This information is not intended to replace advice given to you by your health care provider. Make sure you discuss any questions you have with your health care provider. Document Released: 11/26/2014 Document Revised: 04/12/2016 Document Reviewed: 07/07/2014 Elsevier Interactive Patient Education  2018 [employer].      Vertigo Vertigo is the feeling that you or your surroundings are moving when they are not. Vertigo can be dangerous if it occurs while you are doing something that could endanger you or others, such as driving. What are the causes? This condition is caused by a disturbance in the signals that are sent by your body's sensory systems to your brain. Different causes of a disturbance can lead to vertigo, including:  Infections, especially in the inner ear.  A bad reaction to a drug, or misuse of alcohol and medicines.  Withdrawal from drugs or alcohol.  Quickly changing positions, as when lying down or rolling over in bed.  Migraine headaches.  Decreased blood flow to the brain.  Decreased blood pressure.  Increased pressure in the brain from a head or neck injury, stroke, infection, tumor, or bleeding.  Central nervous system disorders.  What are the signs or symptoms? Symptoms of this condition usually occur when you move your head or your eyes in different directions. Symptoms may start suddenly, and they usually last for less than a minute.  Symptoms may include:  Loss of balance and falling.  Feeling like you are spinning or moving.  Feeling like your surroundings are spinning or moving.  Nausea and vomiting.  Blurred vision or double vision.  Difficulty hearing.  Slurred speech.  Dizziness.  Involuntary eye movement (nystagmus).  Symptoms can be mild and cause only slight annoyance, or they can be severe and interfere with daily life. Episodes of vertigo may return (recur) over time, and they are often triggered by certain movements. Symptoms may improve over time. How is this diagnosed? This condition may be diagnosed based on medical history and the quality of your nystagmus. Your health care provider may test your eye movements by asking you to quickly change positions to trigger the nystagmus. This may be called the Dix-Hallpike test, head thrust test, or roll test. You may be referred to a health care provider who specializes in ear, nose, and throat (ENT) problems (otolaryngologist) or a provider who specializes in disorders of the central nervous system (neurologist). You may have additional testing, including:  A physical exam.  Blood tests.  MRI.  A CT scan.  An electrocardiogram (ECG). This records electrical activity in your heart.  An electroencephalogram (EEG). This records electrical activity in your brain.  Hearing tests.  How  is this treated? Treatment for this condition depends on the cause and the severity of the symptoms. Treatment options include:  Medicines to treat nausea or vertigo. These are usually used for severe cases. Some medicines that are used to treat other conditions may also reduce or eliminate vertigo symptoms. These include: ? Medicines that control allergies (antihistamines). ? Medicines that control seizures (anticonvulsants). ? Medicines that relieve depression (antidepressants). ? Medicines that relieve anxiety (sedatives).  Head movements to adjust your inner ear  back to normal. If your vertigo is caused by an ear problem, your health care provider may recommend certain movements to correct the problem.  Surgery. This is rare.  Follow these instructions at home: Safety  Move slowly.Avoid sudden body or head movements.  Avoid driving.  Avoid operating heavy machinery.  Avoid doing any tasks that would cause danger to you or others if you would have a vertigo episode during the task.  If you have trouble walking or keeping your balance, try using a cane for stability. If you feel dizzy or unstable, sit down right away.  Return to your normal activities as told by your health care provider. Ask your health care provider what activities are safe for you. General instructions  Take over-the-counter and prescription medicines only as told by your health care provider.  Avoid certain positions or movements as told by your health care provider.  Drink enough fluid to keep your urine clear or pale yellow.  Keep all follow-up visits as told by your health care provider. This is important. Contact a health care provider if:  Your medicines do not relieve your vertigo or they make it worse.  You have a fever.  Your condition gets worse or you develop new symptoms.  Your family or friends notice any behavioral changes.  Your nausea or vomiting gets worse.  You have numbness or a "pins and needles" sensation in part of your body. Get help right away if:  You have difficulty moving or speaking.  You are always dizzy.  You faint.  You develop severe headaches.  You have weakness in your hands, arms, or legs.  You have changes in your hearing or vision.  You develop a stiff neck.  You develop sensitivity to light. This information is not intended to replace advice given to you by your health care provider. Make sure you discuss any questions you have with your health care provider. Document Released: 08/15/2005 Document Revised:  04/18/2016 Document Reviewed: 02/28/2015 Elsevier Interactive Patient Education  [employer].

## 2018-01-06 NOTE — Addendum Note (Signed)
Addended by: Sarina Ill B on: 01/06/2018 09:24 AM   Modules accepted: Orders

## 2018-01-08 DIAGNOSIS — D485 Neoplasm of uncertain behavior of skin: Secondary | ICD-10-CM | POA: Diagnosis not present

## 2018-01-08 DIAGNOSIS — D2262 Melanocytic nevi of left upper limb, including shoulder: Secondary | ICD-10-CM | POA: Diagnosis not present

## 2018-01-08 DIAGNOSIS — L570 Actinic keratosis: Secondary | ICD-10-CM | POA: Diagnosis not present

## 2018-01-08 DIAGNOSIS — D225 Melanocytic nevi of trunk: Secondary | ICD-10-CM | POA: Diagnosis not present

## 2018-01-08 DIAGNOSIS — L308 Other specified dermatitis: Secondary | ICD-10-CM | POA: Diagnosis not present

## 2018-01-08 DIAGNOSIS — Z85820 Personal history of malignant melanoma of skin: Secondary | ICD-10-CM | POA: Diagnosis not present

## 2018-01-08 DIAGNOSIS — D1801 Hemangioma of skin and subcutaneous tissue: Secondary | ICD-10-CM | POA: Diagnosis not present

## 2018-01-08 DIAGNOSIS — L821 Other seborrheic keratosis: Secondary | ICD-10-CM | POA: Diagnosis not present

## 2018-01-10 ENCOUNTER — Ambulatory Visit: Payer: Medicare HMO | Admitting: Physical Therapy

## 2018-01-10 ENCOUNTER — Encounter: Payer: Self-pay | Admitting: Physical Therapy

## 2018-01-10 DIAGNOSIS — R2681 Unsteadiness on feet: Secondary | ICD-10-CM

## 2018-01-10 DIAGNOSIS — H8112 Benign paroxysmal vertigo, left ear: Secondary | ICD-10-CM | POA: Diagnosis not present

## 2018-01-10 NOTE — Therapy (Signed)
[facility] [address] [place], [place], [address] Phone: [phone]   Fax:  [phone]  Physical Therapy Treatment  Patient Details  Name: Jordan Hicks MRN: [id number] Date of Birth: 04-Jun-1946 Referring Provider: Geoffery Lyons NP   Encounter Date: 01/10/2018  PT End of Session - 01/10/18 2014    Visit Number  4    Number of Visits  5    Date for PT Re-Evaluation  01/17/18    Authorization Type  [employer] [facility]    Authorization Time Period  12/17/17 to 02/13/18    PT Start Time  2595    PT Stop Time  1445    PT Time Calculation (min)  42 min    Activity Tolerance  Patient tolerated treatment well    Behavior During Therapy  [facility] for tasks assessed/performed       Past Medical History:  Diagnosis Date  . Arthritis    lower back  . Asthma    childhood  . HTN (hypertension)   . Hyperlipidemia   . Sleep apnea    as stated by wife    Past Surgical History:  Procedure Laterality Date  . BICEPS TENDON REPAIR Left   . COLONOSCOPY  2007  . HERNIA REPAIR    . THROAT SURGERY     About 10-72yrs ago    There were no vitals filed for this visit.  Subjective Assessment - 01/10/18 1404    Subjective  States he has not had dizziness this week. This morning felt an imbalance drifting left and corrected to right. This type of imbalance has been "going on for some time." Saw Dr. Jaynee Eagles and she has ordered an MRI, made referral to ENT, and started him on steroids in case of labrynthitis.     Patient Stated Goals  Stop feeling dizzy    Currently in Pain?  No/denies         [facility] PT Assessment - 01/10/18 2004      Sensation   Light Touch  Impaired Detail    Light Touch Impaired Details  Impaired RLE;Impaired LLE    Additional Comments  Assessed with monofilament and identified correct location <30% of the time on bil feet. Of interest, if he did detect the sense of touch it was often 4-6" off from the actual site of  stimulaiton (ie touching 1st toe and he feels sensation in his heel).       Balance   Balance Assessed  Yes      High Level Balance   High Level Balance Comments  standing on blue airex foam EO feet apart with slightly increased sway compared to normal; EC feet apart with immediate excessive sway with ability to catch himself voer period of 20 seconds and then leaning posteriorly and to his right with bumping into wall         Vestibular Assessment - 01/10/18 2007      Vestibular Assessment   General Observation  reports current imbalance causes him to drift to his left, he then overcorrects and drifts to his rt. This began more than 10 weeks ago (which was onset of vertigo).       Symptom Behavior   Type of Dizziness  Imbalance    Frequency of Dizziness  daily      Visual Acuity   Static  line 8    Dynamic  line 8      Auditory   Comments  per MD notes  has reported decline in his hearing      [employer] Testing=73% compared to age/height normative value of 67%.     Patient demonstrated scores of:   97 for use of somatosensory feedback for balance (compared to age/height normative value of 81),  88 for use of visual feedback for balance (compared to age/height normative value of 74),   69 for use of vestibular feedback for balance (compared to age/height normative value of 50).   COG readings were anterior and to the right. His use of ankle vs hip strategies to maintain his balance were WNL except during conditions with high vestibular demand and he was biased towards hip strategies.                [facility] PT Treatment/Exercise - 01/10/18 2004      Transfers   Transfers  Sit to Stand;Stand to Sit    Sit to Stand  7: Independent no symptoms      Ambulation/Gait   Ambulation/Gait Assistance  7: Independent    Ambulation Distance (Feet)  200 Feet    Assistive device   None    Gait Pattern  Within Functional Limits    Ambulation Surface  Indoor      Balance   Balance Assessed  Yes      Static Standing Balance   Single Leg Stance - Right Leg  30    Single Leg Stance - Left Leg  30                      PT Education - 01/10/18 2014    Education provided  Yes    Education Details  purpose of and results of Sensory Organizaiton Test    Person(s) Educated  Patient    Methods  Explanation    Comprehension  Verbalized understanding          PT Long Term Goals - 12/18/17 0544      PT LONG TERM GOAL #1   Title  Patient will have negative positional testing for BPPV (Target 01/16/18)    Time  4    Period  Weeks    Status  New    Target Date  01/16/18      PT LONG TERM GOAL #2   Title  Patient will ambulate with head turns in all directions with <6 inch sway off path.     Time  4    Period  Weeks    Status  New            Plan - 01/10/18 2021    Clinical Impression Statement  Patient denies spinning/vertigo however reports drifting to his left followed by overcorrection and then drifts to his right. He saw the neurologist earlier this week and she has prescirbed steroid in case of labrynthitis, scheduled an MRI of the brain, and referred him to an ENT. More indepth balance assessments completed including Sensory Organization Test which showed above average scores for male his age in all categories. He consistently showed a bias in his center of gravity anterior and to the right. Sensory testing in his feet showed significantly diminished sense of light touch in bil feet (with monofilament testing correctly identified location of touch in 3 of 10 places on rt foot and 5 of 10 on left foot). Patient agreed to schedule his remaining PT appointment after results of ENT visit  and MRI are known. At that time, can provide a balance HEP and determine if further PT is appropriate.     Rehab Potential  Excellent    PT Frequency  1x / week    PT  Duration  4 weeks    PT Treatment/Interventions  ADLs/Self Care Home Management;Canalith Repostioning;Gait training;Therapeutic activities;Therapeutic exercise;Balance training;Neuromuscular re-education;Patient/family education;Vestibular    PT Next Visit Plan  ? give HEP; ? recert to cover visit    Consulted and Agree with Plan of Care  Patient       Patient will benefit from skilled therapeutic intervention in order to improve the following deficits and impairments:  Decreased activity tolerance, Decreased balance, Dizziness, Impaired vision/preception  Visit Diagnosis: Unsteadiness on feet     Problem List Patient Active Problem List   Diagnosis Date Noted  . Vertigo 01/06/2018  . Left shoulder pain 01/17/2012  . Premature ventricular contractions 01/17/2012    Jeanie Cooks Elvi Leventhal. PT 01/10/2018, 9:10 PM  [facility] [address] [place], [place], [address] Phone: [phone]   Fax:  [phone]  Name: Jordan Hicks MRN: [id number] Date of Birth: 09-17-1946

## 2018-01-15 ENCOUNTER — Ambulatory Visit
Admission: RE | Admit: 2018-01-15 | Discharge: 2018-01-15 | Disposition: A | Discharge status: Home / Self Care | Payer: Medicare HMO | Source: Ambulatory Visit | Attending: Neurology | Admitting: Neurology

## 2018-01-15 DIAGNOSIS — A881 Epidemic vertigo: Secondary | ICD-10-CM

## 2018-01-15 DIAGNOSIS — H9190 Unspecified hearing loss, unspecified ear: Secondary | ICD-10-CM

## 2018-01-15 DIAGNOSIS — R42 Dizziness and giddiness: Secondary | ICD-10-CM

## 2018-01-15 MED ORDER — GADOBENATE DIMEGLUMINE 529 MG/ML IV SOLN
20.0000 mL | Freq: Once | INTRAVENOUS | Status: AC | PRN
  Administered 2018-01-15: 20 mL via INTRAVENOUS

## 2018-01-16 DIAGNOSIS — H9312 Tinnitus, left ear: Secondary | ICD-10-CM | POA: Diagnosis not present

## 2018-01-16 DIAGNOSIS — H8110 Benign paroxysmal vertigo, unspecified ear: Secondary | ICD-10-CM | POA: Diagnosis not present

## 2018-01-16 DIAGNOSIS — H903 Sensorineural hearing loss, bilateral: Secondary | ICD-10-CM | POA: Diagnosis not present

## 2018-01-17 ENCOUNTER — Telehealth: Payer: Self-pay | Admitting: Neurology

## 2018-01-17 NOTE — Telephone Encounter (Signed)
Spoke with patient and informed him that his MRI brain is normal for his age. He verbalized appreciation and understanding. His questions were answered.

## 2018-01-17 NOTE — Telephone Encounter (Signed)
Pt called request MRI result. Pt is aware the clinic closes at noon and is aware Dr Jaynee Eagles is not in the clinic today. Please call to advise

## 2018-01-17 NOTE — Telephone Encounter (Signed)
-----   Message from Melvenia Beam, MD sent at 01/17/2018 10:42 AM EST ----- Brain normal for age

## 2018-01-21 ENCOUNTER — Encounter: Payer: Self-pay | Admitting: Physical Therapy

## 2018-01-21 NOTE — Therapy (Signed)
[facility] [address] [place], [place], [address] Phone: [phone]   Fax:  [phone]  Patient Details  Name: Jordan Hicks MRN: [id number] Date of Birth: 19-Apr-1946 Referring Provider:  No ref. provider found  Encounter Date: 01/21/2018  PHYSICAL THERAPY DISCHARGE SUMMARY  Visits from Start of Care: 4  Current functional level related to goals / functional outcomes: Met 1 goal and 2nd goal could not be assessed as did not return for final visit (called and reported he was better and did not need further therapy)   Remaining deficits: Unknown, unable to assess   Education / Equipment: HEP  Plan: Patient agrees to discharge.  Patient goals were partially met. Patient is being discharged due to being pleased with the current functional level.  ?????       Rexanne Mano, PT 01/21/2018, 8:16 AM  [facility] [address] [place], [place], [address] Phone: [phone]   Fax:  [phone]

## 2018-01-22 DIAGNOSIS — H8110 Benign paroxysmal vertigo, unspecified ear: Secondary | ICD-10-CM | POA: Diagnosis not present

## 2018-01-24 ENCOUNTER — Encounter: Payer: Self-pay | Admitting: Physical Therapy

## 2018-02-05 DIAGNOSIS — I10 Essential (primary) hypertension: Secondary | ICD-10-CM | POA: Diagnosis not present

## 2018-02-05 DIAGNOSIS — Z125 Encounter for screening for malignant neoplasm of prostate: Secondary | ICD-10-CM | POA: Diagnosis not present

## 2018-02-05 DIAGNOSIS — R82998 Other abnormal findings in urine: Secondary | ICD-10-CM | POA: Diagnosis not present

## 2018-02-12 DIAGNOSIS — R42 Dizziness and giddiness: Secondary | ICD-10-CM | POA: Diagnosis not present

## 2018-02-12 DIAGNOSIS — Z1389 Encounter for screening for other disorder: Secondary | ICD-10-CM | POA: Diagnosis not present

## 2018-02-12 DIAGNOSIS — H8112 Benign paroxysmal vertigo, left ear: Secondary | ICD-10-CM | POA: Diagnosis not present

## 2018-02-12 DIAGNOSIS — Z0000 Encounter for general adult medical examination without abnormal findings: Secondary | ICD-10-CM | POA: Diagnosis not present

## 2018-02-12 DIAGNOSIS — E7849 Other hyperlipidemia: Secondary | ICD-10-CM | POA: Diagnosis not present

## 2018-02-12 DIAGNOSIS — I10 Essential (primary) hypertension: Secondary | ICD-10-CM | POA: Diagnosis not present

## 2018-02-12 DIAGNOSIS — G250 Essential tremor: Secondary | ICD-10-CM | POA: Diagnosis not present

## 2018-02-12 DIAGNOSIS — M7021 Olecranon bursitis, right elbow: Secondary | ICD-10-CM | POA: Diagnosis not present

## 2018-02-12 DIAGNOSIS — Z6823 Body mass index (BMI) 23.0-23.9, adult: Secondary | ICD-10-CM | POA: Diagnosis not present

## 2018-02-12 DIAGNOSIS — J309 Allergic rhinitis, unspecified: Secondary | ICD-10-CM | POA: Diagnosis not present

## 2018-07-01 DIAGNOSIS — M7581 Other shoulder lesions, right shoulder: Secondary | ICD-10-CM | POA: Diagnosis not present

## 2018-07-01 DIAGNOSIS — M542 Cervicalgia: Secondary | ICD-10-CM | POA: Diagnosis not present

## 2018-07-02 DIAGNOSIS — M25511 Pain in right shoulder: Secondary | ICD-10-CM | POA: Diagnosis not present

## 2018-07-02 DIAGNOSIS — S46092D Other injury of muscle(s) and tendon(s) of the rotator cuff of left shoulder, subsequent encounter: Secondary | ICD-10-CM | POA: Diagnosis not present

## 2018-07-03 DIAGNOSIS — R69 Illness, unspecified: Secondary | ICD-10-CM | POA: Diagnosis not present

## 2018-07-09 DIAGNOSIS — S46092D Other injury of muscle(s) and tendon(s) of the rotator cuff of left shoulder, subsequent encounter: Secondary | ICD-10-CM | POA: Diagnosis not present

## 2018-07-09 DIAGNOSIS — M25511 Pain in right shoulder: Secondary | ICD-10-CM | POA: Diagnosis not present

## 2018-07-10 DIAGNOSIS — L853 Xerosis cutis: Secondary | ICD-10-CM | POA: Diagnosis not present

## 2018-07-10 DIAGNOSIS — D225 Melanocytic nevi of trunk: Secondary | ICD-10-CM | POA: Diagnosis not present

## 2018-07-10 DIAGNOSIS — D1801 Hemangioma of skin and subcutaneous tissue: Secondary | ICD-10-CM | POA: Diagnosis not present

## 2018-07-10 DIAGNOSIS — Z85820 Personal history of malignant melanoma of skin: Secondary | ICD-10-CM | POA: Diagnosis not present

## 2018-07-10 DIAGNOSIS — L821 Other seborrheic keratosis: Secondary | ICD-10-CM | POA: Diagnosis not present

## 2018-07-24 DIAGNOSIS — M25511 Pain in right shoulder: Secondary | ICD-10-CM | POA: Diagnosis not present

## 2018-07-24 DIAGNOSIS — S46092D Other injury of muscle(s) and tendon(s) of the rotator cuff of left shoulder, subsequent encounter: Secondary | ICD-10-CM | POA: Diagnosis not present

## 2018-08-08 ENCOUNTER — Emergency Department (HOSPITAL_COMMUNITY): Payer: Medicare HMO

## 2018-08-08 ENCOUNTER — Observation Stay (HOSPITAL_COMMUNITY)
Admission: EM | Admit: 2018-08-08 | Discharge: 2018-08-10 | Disposition: A | Discharge status: Home / Self Care | Payer: Medicare HMO | Attending: Internal Medicine | Admitting: Internal Medicine

## 2018-08-08 ENCOUNTER — Encounter (HOSPITAL_COMMUNITY): Payer: Self-pay | Admitting: Oncology

## 2018-08-08 DIAGNOSIS — S02401A Maxillary fracture, unspecified, initial encounter for closed fracture: Secondary | ICD-10-CM | POA: Diagnosis present

## 2018-08-08 DIAGNOSIS — Z1211 Encounter for screening for malignant neoplasm of colon: Secondary | ICD-10-CM

## 2018-08-08 DIAGNOSIS — I951 Orthostatic hypotension: Secondary | ICD-10-CM | POA: Insufficient documentation

## 2018-08-08 DIAGNOSIS — R42 Dizziness and giddiness: Secondary | ICD-10-CM | POA: Diagnosis not present

## 2018-08-08 DIAGNOSIS — N179 Acute kidney failure, unspecified: Secondary | ICD-10-CM | POA: Insufficient documentation

## 2018-08-08 DIAGNOSIS — I5030 Unspecified diastolic (congestive) heart failure: Secondary | ICD-10-CM | POA: Diagnosis not present

## 2018-08-08 DIAGNOSIS — R079 Chest pain, unspecified: Secondary | ICD-10-CM

## 2018-08-08 DIAGNOSIS — Z79899 Other long term (current) drug therapy: Secondary | ICD-10-CM | POA: Insufficient documentation

## 2018-08-08 DIAGNOSIS — E860 Dehydration: Secondary | ICD-10-CM | POA: Diagnosis not present

## 2018-08-08 DIAGNOSIS — I110 Hypertensive heart disease with heart failure: Secondary | ICD-10-CM | POA: Insufficient documentation

## 2018-08-08 DIAGNOSIS — S0230XA Fracture of orbital floor, unspecified side, initial encounter for closed fracture: Secondary | ICD-10-CM | POA: Diagnosis present

## 2018-08-08 DIAGNOSIS — R69 Illness, unspecified: Secondary | ICD-10-CM | POA: Diagnosis not present

## 2018-08-08 DIAGNOSIS — R55 Syncope and collapse: Secondary | ICD-10-CM | POA: Diagnosis not present

## 2018-08-08 DIAGNOSIS — Z8249 Family history of ischemic heart disease and other diseases of the circulatory system: Secondary | ICD-10-CM | POA: Diagnosis not present

## 2018-08-08 DIAGNOSIS — I493 Ventricular premature depolarization: Secondary | ICD-10-CM | POA: Diagnosis not present

## 2018-08-08 DIAGNOSIS — F10229 Alcohol dependence with intoxication, unspecified: Secondary | ICD-10-CM | POA: Insufficient documentation

## 2018-08-08 DIAGNOSIS — S01511A Laceration without foreign body of lip, initial encounter: Secondary | ICD-10-CM | POA: Insufficient documentation

## 2018-08-08 DIAGNOSIS — Z7982 Long term (current) use of aspirin: Secondary | ICD-10-CM | POA: Insufficient documentation

## 2018-08-08 DIAGNOSIS — Z9889 Other specified postprocedural states: Secondary | ICD-10-CM | POA: Diagnosis not present

## 2018-08-08 DIAGNOSIS — W1789XA Other fall from one level to another, initial encounter: Secondary | ICD-10-CM | POA: Diagnosis not present

## 2018-08-08 DIAGNOSIS — S0240FA Zygomatic fracture, left side, initial encounter for closed fracture: Secondary | ICD-10-CM | POA: Insufficient documentation

## 2018-08-08 DIAGNOSIS — S0292XA Unspecified fracture of facial bones, initial encounter for closed fracture: Secondary | ICD-10-CM

## 2018-08-08 DIAGNOSIS — R0789 Other chest pain: Secondary | ICD-10-CM | POA: Diagnosis not present

## 2018-08-08 DIAGNOSIS — S0240DA Maxillary fracture, left side, initial encounter for closed fracture: Secondary | ICD-10-CM | POA: Diagnosis not present

## 2018-08-08 DIAGNOSIS — S0232XA Fracture of orbital floor, left side, initial encounter for closed fracture: Secondary | ICD-10-CM | POA: Diagnosis not present

## 2018-08-08 DIAGNOSIS — W19XXXA Unspecified fall, initial encounter: Secondary | ICD-10-CM | POA: Diagnosis not present

## 2018-08-08 DIAGNOSIS — S199XXA Unspecified injury of neck, initial encounter: Secondary | ICD-10-CM | POA: Diagnosis not present

## 2018-08-08 DIAGNOSIS — M1990 Unspecified osteoarthritis, unspecified site: Secondary | ICD-10-CM | POA: Insufficient documentation

## 2018-08-08 DIAGNOSIS — Z888 Allergy status to other drugs, medicaments and biological substances status: Secondary | ICD-10-CM | POA: Diagnosis not present

## 2018-08-08 DIAGNOSIS — Y906 Blood alcohol level of 120-199 mg/100 ml: Secondary | ICD-10-CM | POA: Insufficient documentation

## 2018-08-08 DIAGNOSIS — G4733 Obstructive sleep apnea (adult) (pediatric): Secondary | ICD-10-CM | POA: Diagnosis not present

## 2018-08-08 DIAGNOSIS — E785 Hyperlipidemia, unspecified: Secondary | ICD-10-CM | POA: Insufficient documentation

## 2018-08-08 DIAGNOSIS — F1010 Alcohol abuse, uncomplicated: Secondary | ICD-10-CM | POA: Diagnosis present

## 2018-08-08 DIAGNOSIS — S022XXA Fracture of nasal bones, initial encounter for closed fracture: Principal | ICD-10-CM | POA: Insufficient documentation

## 2018-08-08 DIAGNOSIS — I10 Essential (primary) hypertension: Secondary | ICD-10-CM

## 2018-08-08 DIAGNOSIS — S025XXA Fracture of tooth (traumatic), initial encounter for closed fracture: Secondary | ICD-10-CM | POA: Diagnosis present

## 2018-08-08 DIAGNOSIS — R0902 Hypoxemia: Secondary | ICD-10-CM | POA: Diagnosis not present

## 2018-08-08 DIAGNOSIS — S0219XA Other fracture of base of skull, initial encounter for closed fracture: Secondary | ICD-10-CM | POA: Diagnosis present

## 2018-08-08 LAB — CBC
HCT: 41 % (ref 39.0–52.0)
Hemoglobin: 13.7 g/dL (ref 13.0–17.0)
MCH: 33.8 pg (ref 26.0–34.0)
MCHC: 33.4 g/dL (ref 30.0–36.0)
MCV: 101.2 fL — ABNORMAL HIGH (ref 78.0–100.0)
PLATELETS: 197 10*3/uL (ref 150–400)
RBC: 4.05 MIL/uL — AB (ref 4.22–5.81)
RDW: 12 % (ref 11.5–15.5)
WBC: 6.9 10*3/uL (ref 4.0–10.5)

## 2018-08-08 LAB — I-STAT TROPONIN, ED: Troponin i, poc: 0 ng/mL (ref 0.00–0.08)

## 2018-08-08 NOTE — ED Triage Notes (Signed)
Pt bib [facility] from an event s/p syncopal episode and fall.  Pt reported having syncopal episodes in the past w/o cardiac hx.  Pt had four glasses of wine at event.  Pt has avulsed front tooth, laceration above lip and skin tear to arm.  C-collar in place.  Pt A&O x 4.

## 2018-08-08 NOTE — ED Provider Notes (Signed)
[facility]PART[facility] Provider Note   CSN: [id number] Arrival date & time: 08/08/18  2305     History   Chief Complaint Chief Complaint  Patient presents with  . Loss of Consciousness    HPI Jordan Hicks is a 72 y.o. male.  Patient presents to the emergency department by ambulance after a syncopal episode.  Patient reports that he was walking and lost consciousness suddenly.  There was no warning, no precursor symptoms.  Patient does suffer from vertigo, but he did not have any vertigo symptoms prior to the episode.  Patient reports that he did pass out one time approximately 6 weeks ago.  At that time he stood up fast, felt slightly dizzy and then passed out.  Patient complaining of facial pain.  He reports that he fell straight forward and did hit the ground with his face.  He reports that he remembers walking in the next thing he remembers was waking up in the ambulance.     Past Medical History:  Diagnosis Date  . Arthritis    lower back  . Asthma    childhood  . HTN (hypertension)   . Hyperlipidemia   . Sleep apnea    as stated by wife    Patient Active Problem List   Diagnosis Date Noted  . AKI (acute kidney injury) ([place]) 08/09/2018  . Syncope 08/09/2018  . Fall 08/09/2018  . Fracture of maxilla ([place]) 08/09/2018  . Nasal bone fractures 08/09/2018  . Pterygoid plate fracture ([place]) 08/09/2018  . Orbital floor fracture ([place]) 08/09/2018  . Fracture of incisor teeth 08/09/2018  . HTN (hypertension)   . Hyperlipidemia   . Asthma   . Vertigo 01/06/2018  . Left shoulder pain 01/17/2012  . Premature ventricular contractions 01/17/2012    Past Surgical History:  Procedure Laterality Date  . BICEPS TENDON REPAIR Left   . COLONOSCOPY  2007  . HERNIA REPAIR    . THROAT SURGERY     About 10-34yrs ago        Home Medications    Prior to Admission medications   Medication Sig Start Date End Date Taking? Authorizing Provider   aspirin 81 MG tablet Take 81 mg by mouth daily.   Yes [provider]  Multiple Vitamin (MULTIVITAMIN WITH MINERALS) TABS tablet Take 1 tablet by mouth daily.   Yes [provider]  niacin 500 MG tablet Take 500 mg by mouth daily with breakfast.   Yes [provider]  valsartan (DIOVAN) 160 MG tablet Take 160 mg by mouth daily. 06/02/18  Yes [provider]  methylPREDNISolone (MEDROL DOSEPAK) 4 MG TBPK tablet follow package directions Patient not taking: Reported on 08/09/2018 01/06/18   Melvenia Beam, MD    Family History Family History  Problem Relation Age of Onset  . Emphysema Mother   . Heart attack Father   . Colon cancer Neg Hx     Social History Social History   Tobacco Use  . Smoking status: Never Smoker  . Smokeless tobacco: Never Used  Substance Use Topics  . Alcohol use: Yes    Comment: 1 Bottle of Wine a Day  . Drug use: No     Allergies   Patient has no known allergies.   Review of Systems Review of Systems  Constitutional: Negative for fever.  HENT: Positive for facial swelling.   Respiratory: Negative for shortness of breath.   Cardiovascular: Negative for chest pain and palpitations.  Neurological:  Positive for syncope. Negative for dizziness.  All other systems reviewed and are negative.    Physical Exam Updated Vital Signs BP (!) 143/81   Pulse 80   Temp (!) 97.5 F (36.4 C) (Oral)   Resp (!) 21   Ht 6\' 2"  (1.88 m)   Wt 82.1 kg   SpO2 100%   BMI 23.24 kg/m   Physical Exam  Constitutional: He is oriented to person, place, and time. He appears well-developed and well-nourished. No distress.  HENT:  Head: Normocephalic. Head is with abrasion and with contusion.  Right Ear: Hearing normal.  Left Ear: Hearing normal.  Nose: Nose normal.  Mouth/Throat: Oropharynx is clear and moist and mucous membranes are normal. Abnormal dentition.  Eyes: Pupils are equal, round, and reactive to light. Conjunctivae  and EOM are normal.  Neck: Normal range of motion. Neck supple.  Cardiovascular: Regular rhythm, S1 normal and S2 normal. Exam reveals no gallop and no friction rub.  No murmur heard. Pulmonary/Chest: Effort normal and breath sounds normal. No respiratory distress. He exhibits no tenderness.  Abdominal: Soft. Normal appearance and bowel sounds are normal. There is no hepatosplenomegaly. There is no tenderness. There is no rebound, no guarding, no tenderness at McBurney's point and negative Murphy's sign. No hernia.  Musculoskeletal: Normal range of motion.  Neurological: He is alert and oriented to person, place, and time. He has normal strength. No cranial nerve deficit or sensory deficit. Coordination normal. GCS eye subscore is 4. GCS verbal subscore is 5. GCS motor subscore is 6.  Skin: Skin is warm and dry. Abrasion noted. No rash noted. No cyanosis.     Psychiatric: He has a normal mood and affect. His speech is normal and behavior is normal. Thought content normal.  Nursing note and vitals reviewed.    ED Treatments / Results  Labs (all labs ordered are listed, but only abnormal results are displayed) Labs Reviewed  BASIC METABOLIC PANEL - Abnormal; Notable for the following components:      Result Value   Sodium 134 (*)    CO2 20 (*)    Glucose, Bld 102 (*)    Creatinine, Ser 1.30 (*)    Calcium 8.8 (*)    GFR calc non Af Amer 53 (*)    Anion gap 16 (*)    All other components within normal limits  CBC - Abnormal; Notable for the following components:   RBC 4.05 (*)    MCV 101.2 (*)    All other components within normal limits  ETHANOL - Abnormal; Notable for the following components:   Alcohol, Ethyl (B) 193 (*)    All other components within normal limits  URINALYSIS, ROUTINE W REFLEX MICROSCOPIC  CBG MONITORING, ED  I-STAT TROPONIN, ED    EKG EKG Interpretation  Date/Time:  Friday August 08 2018 23:11:06 EDT Ventricular Rate:  82 PR Interval:    QRS  Duration: 104 QT Interval:  394 QTC Calculation: 461 R Axis:   51 Text Interpretation:  Sinus rhythm Multiform ventricular premature complexes Confirmed by Orpah Greek [phone]) on 08/08/2018 11:14:02 PM   Radiology Ct Head Wo Contrast  Result Date: 08/09/2018 CLINICAL DATA:  Maxillofacial trauma after syncopal episode. Avulsed front tooth and laceration above lip. EXAM: CT HEAD WITHOUT CONTRAST CT MAXILLOFACIAL WITHOUT CONTRAST CT CERVICAL SPINE WITHOUT CONTRAST TECHNIQUE: Multidetector CT imaging of the head, cervical spine, and maxillofacial structures were performed using the standard protocol without intravenous contrast. Multiplanar CT image reconstructions of the cervical  spine and maxillofacial structures were also generated. COMPARISON:  None. FINDINGS: CT HEAD FINDINGS Brain: Age related involutional changes of the brain without acute intracranial hemorrhage, midline shift or edema. No large vascular territory infarction. No hydrocephalus. No intra-axial mass nor extra-axial fluid. Vascular: No hyperdense vessel sign. Minimal atherosclerosis of the skull base. Skull: No acute skull fracture. Please see the maxillofacial CT report below for details on facial fractures. Other: Soft tissue swelling of the upper lip. CT MAXILLOFACIAL FINDINGS Osseous: The following acute fractures are identified: 1. Bilateral nasal bone fractures with minimal displacement of the tip of the right nasal bone laterally. 2. Fractures of the anterior, lateral and medial wall of the left maxillary sinus with minimal posterior displacement of the anterior wall fracture fragment by 3 mm. 3. Fracture at the junction of the left zygomatic arch and maxillary sinus, series 10/49. The remainder of the zygomatic arch appears intact. 4. Fractures of the lateral pterygoid plates bilaterally. 5. Fracture of the lateral wall as well as orbital floor of the left orbit without significant displacement. 6. Avulsed left upper  central incisor. 7. Fracture of the nasal septum along its superior aspect, series 14/39. Orbits: Intact optic nerves, extraocular muscles and globes. No lens did attachments. Sinuses: Blood products opacifying much of the left maxillary sinus and moderately opacifying the ethmoid sinus. The right maxillary and sphenoid sinuses are clear as is the frontal sinus. Soft tissues: Soft tissue swelling of the upper lip. Mild left supraorbital soft tissue swelling. CT CERVICAL SPINE FINDINGS Alignment: Slight straightening of the cervical spine possibly positional or due to muscle spasm. Skull base and vertebrae: No acute fracture. No primary bone lesion or focal pathologic process. Soft tissues and spinal canal: No prevertebral fluid or swelling. No visible canal hematoma. Disc levels: Moderate disc flattening C4 through C7 with posterior marginal osteophytes and uncovertebral joint osteoarthritis. Multilevel degenerative facet arthropathy. No significant central or foraminal encroachment. Upper chest: Negative. Other: None IMPRESSION: CT head: No acute intracranial abnormality. CT maxillofacial: Upper lip and supraorbital soft tissue swelling associated with fractures as below: 1. Bilateral nasal bone fractures with minimal displacement of the tip of the right nasal bone laterally. 2. Fractures of the anterior, lateral and medial wall of the left maxillary sinus with minimal posterior displacement of the anterior wall fracture fragment by 3 mm. 3. Fracture at the junction of the left zygomatic arch and maxillary sinus, series 10/49. The remainder of the zygomatic arch appears intact. 4. Fractures of the lateral pterygoid plates bilaterally. 5. Fracture of the lateral wall as well as orbital floor of the left orbit without significant displacement. 6. Avulsed left upper central incisor. 7. Fracture of the nasal septum along its superior aspect, series 14/39. Posttraumatic opacification of the left maxillary sinus with  blood products as well as moderate opacification of the ethmoid sinus. CT cervical spine: No acute cervical spine fracture. Degenerative disc disease C4 through C7. Electronically Signed   By: Ashley Royalty M.D.   On: 08/09/2018 00:40   Ct Cervical Spine Wo Contrast  Result Date: 08/09/2018 CLINICAL DATA:  Maxillofacial trauma after syncopal episode. Avulsed front tooth and laceration above lip. EXAM: CT HEAD WITHOUT CONTRAST CT MAXILLOFACIAL WITHOUT CONTRAST CT CERVICAL SPINE WITHOUT CONTRAST TECHNIQUE: Multidetector CT imaging of the head, cervical spine, and maxillofacial structures were performed using the standard protocol without intravenous contrast. Multiplanar CT image reconstructions of the cervical spine and maxillofacial structures were also generated. COMPARISON:  None. FINDINGS: CT HEAD FINDINGS Brain: Age related  involutional changes of the brain without acute intracranial hemorrhage, midline shift or edema. No large vascular territory infarction. No hydrocephalus. No intra-axial mass nor extra-axial fluid. Vascular: No hyperdense vessel sign. Minimal atherosclerosis of the skull base. Skull: No acute skull fracture. Please see the maxillofacial CT report below for details on facial fractures. Other: Soft tissue swelling of the upper lip. CT MAXILLOFACIAL FINDINGS Osseous: The following acute fractures are identified: 1. Bilateral nasal bone fractures with minimal displacement of the tip of the right nasal bone laterally. 2. Fractures of the anterior, lateral and medial wall of the left maxillary sinus with minimal posterior displacement of the anterior wall fracture fragment by 3 mm. 3. Fracture at the junction of the left zygomatic arch and maxillary sinus, series 10/49. The remainder of the zygomatic arch appears intact. 4. Fractures of the lateral pterygoid plates bilaterally. 5. Fracture of the lateral wall as well as orbital floor of the left orbit without significant displacement. 6. Avulsed  left upper central incisor. 7. Fracture of the nasal septum along its superior aspect, series 14/39. Orbits: Intact optic nerves, extraocular muscles and globes. No lens did attachments. Sinuses: Blood products opacifying much of the left maxillary sinus and moderately opacifying the ethmoid sinus. The right maxillary and sphenoid sinuses are clear as is the frontal sinus. Soft tissues: Soft tissue swelling of the upper lip. Mild left supraorbital soft tissue swelling. CT CERVICAL SPINE FINDINGS Alignment: Slight straightening of the cervical spine possibly positional or due to muscle spasm. Skull base and vertebrae: No acute fracture. No primary bone lesion or focal pathologic process. Soft tissues and spinal canal: No prevertebral fluid or swelling. No visible canal hematoma. Disc levels: Moderate disc flattening C4 through C7 with posterior marginal osteophytes and uncovertebral joint osteoarthritis. Multilevel degenerative facet arthropathy. No significant central or foraminal encroachment. Upper chest: Negative. Other: None IMPRESSION: CT head: No acute intracranial abnormality. CT maxillofacial: Upper lip and supraorbital soft tissue swelling associated with fractures as below: 1. Bilateral nasal bone fractures with minimal displacement of the tip of the right nasal bone laterally. 2. Fractures of the anterior, lateral and medial wall of the left maxillary sinus with minimal posterior displacement of the anterior wall fracture fragment by 3 mm. 3. Fracture at the junction of the left zygomatic arch and maxillary sinus, series 10/49. The remainder of the zygomatic arch appears intact. 4. Fractures of the lateral pterygoid plates bilaterally. 5. Fracture of the lateral wall as well as orbital floor of the left orbit without significant displacement. 6. Avulsed left upper central incisor. 7. Fracture of the nasal septum along its superior aspect, series 14/39. Posttraumatic opacification of the left maxillary  sinus with blood products as well as moderate opacification of the ethmoid sinus. CT cervical spine: No acute cervical spine fracture. Degenerative disc disease C4 through C7. Electronically Signed   By: Ashley Royalty M.D.   On: 08/09/2018 00:40   Ct Maxillofacial Wo Contrast  Result Date: 08/09/2018 CLINICAL DATA:  Maxillofacial trauma after syncopal episode. Avulsed front tooth and laceration above lip. EXAM: CT HEAD WITHOUT CONTRAST CT MAXILLOFACIAL WITHOUT CONTRAST CT CERVICAL SPINE WITHOUT CONTRAST TECHNIQUE: Multidetector CT imaging of the head, cervical spine, and maxillofacial structures were performed using the standard protocol without intravenous contrast. Multiplanar CT image reconstructions of the cervical spine and maxillofacial structures were also generated. COMPARISON:  None. FINDINGS: CT HEAD FINDINGS Brain: Age related involutional changes of the brain without acute intracranial hemorrhage, midline shift or edema. No large vascular territory infarction.  No hydrocephalus. No intra-axial mass nor extra-axial fluid. Vascular: No hyperdense vessel sign. Minimal atherosclerosis of the skull base. Skull: No acute skull fracture. Please see the maxillofacial CT report below for details on facial fractures. Other: Soft tissue swelling of the upper lip. CT MAXILLOFACIAL FINDINGS Osseous: The following acute fractures are identified: 1. Bilateral nasal bone fractures with minimal displacement of the tip of the right nasal bone laterally. 2. Fractures of the anterior, lateral and medial wall of the left maxillary sinus with minimal posterior displacement of the anterior wall fracture fragment by 3 mm. 3. Fracture at the junction of the left zygomatic arch and maxillary sinus, series 10/49. The remainder of the zygomatic arch appears intact. 4. Fractures of the lateral pterygoid plates bilaterally. 5. Fracture of the lateral wall as well as orbital floor of the left orbit without significant displacement.  6. Avulsed left upper central incisor. 7. Fracture of the nasal septum along its superior aspect, series 14/39. Orbits: Intact optic nerves, extraocular muscles and globes. No lens did attachments. Sinuses: Blood products opacifying much of the left maxillary sinus and moderately opacifying the ethmoid sinus. The right maxillary and sphenoid sinuses are clear as is the frontal sinus. Soft tissues: Soft tissue swelling of the upper lip. Mild left supraorbital soft tissue swelling. CT CERVICAL SPINE FINDINGS Alignment: Slight straightening of the cervical spine possibly positional or due to muscle spasm. Skull base and vertebrae: No acute fracture. No primary bone lesion or focal pathologic process. Soft tissues and spinal canal: No prevertebral fluid or swelling. No visible canal hematoma. Disc levels: Moderate disc flattening C4 through C7 with posterior marginal osteophytes and uncovertebral joint osteoarthritis. Multilevel degenerative facet arthropathy. No significant central or foraminal encroachment. Upper chest: Negative. Other: None IMPRESSION: CT head: No acute intracranial abnormality. CT maxillofacial: Upper lip and supraorbital soft tissue swelling associated with fractures as below: 1. Bilateral nasal bone fractures with minimal displacement of the tip of the right nasal bone laterally. 2. Fractures of the anterior, lateral and medial wall of the left maxillary sinus with minimal posterior displacement of the anterior wall fracture fragment by 3 mm. 3. Fracture at the junction of the left zygomatic arch and maxillary sinus, series 10/49. The remainder of the zygomatic arch appears intact. 4. Fractures of the lateral pterygoid plates bilaterally. 5. Fracture of the lateral wall as well as orbital floor of the left orbit without significant displacement. 6. Avulsed left upper central incisor. 7. Fracture of the nasal septum along its superior aspect, series 14/39. Posttraumatic opacification of the left  maxillary sinus with blood products as well as moderate opacification of the ethmoid sinus. CT cervical spine: No acute cervical spine fracture. Degenerative disc disease C4 through C7. Electronically Signed   By: Ashley Royalty M.D.   On: 08/09/2018 00:40    Procedures .Marland KitchenLaceration Repair Date/Time: 08/09/2018 4:12 AM Performed by: Orpah Greek, MD Authorized by: Orpah Greek, MD   Consent:    Consent obtained:  Verbal   Risks discussed:  Infection and poor cosmetic result Universal protocol:    Procedure explained and questions answered to patient or proxy's satisfaction: yes     Imaging studies available: yes     Site/side marked: yes     Immediately prior to procedure, a time out was called: yes     Patient identity confirmed:  Verbally with patient Anesthesia (see MAR for exact dosages):    Anesthesia method:  None Laceration details:    Location:  Lip  Lip location:  Upper lip, full thickness   [place] border involved: no     Length (cm):  0.8 Repair type:    Repair type:  Simple Exploration:    Contaminated: no   Treatment:    Area cleansed with:  Hibiclens   Amount of cleaning:  Standard Skin repair:    Repair method:  Tissue adhesive Approximation:    Approximation:  Close   (including critical care time)  Medications Ordered in ED Medications - No data to display   Initial Impression / Assessment and Plan / ED Course  I have reviewed the triage vital signs and the nursing notes.  Pertinent labs & imaging results that were available during my care of the patient were reviewed by me and considered in my medical decision making (see chart for details).     Patient presents to the emergency department for evaluation after a syncopal episode.  Patient reports that he was walking when he suddenly collapsed.  He fell forward, suffering significant facial injury.  Patient has multiple nondisplaced fractures.  None of these will require specific  intervention.  The remainder of his work-up has been negative.  He has done well here in the ER, no arrhythmias.  He has a normal neurologic evaluation.  Recommend observation evaluation secondary to syncope.  Final Clinical Impressions(s) / ED Diagnoses   Final diagnoses:  Syncope and collapse  Multiple closed fractures of facial bone, initial encounter [facility])    ED Discharge Orders    None       Orpah Greek, MD 08/09/18 [phone]

## 2018-08-09 ENCOUNTER — Observation Stay (HOSPITAL_COMMUNITY): Payer: Medicare HMO

## 2018-08-09 ENCOUNTER — Other Ambulatory Visit: Payer: Self-pay

## 2018-08-09 ENCOUNTER — Encounter (HOSPITAL_COMMUNITY): Payer: Self-pay | Admitting: Internal Medicine

## 2018-08-09 DIAGNOSIS — R0781 Pleurodynia: Secondary | ICD-10-CM | POA: Diagnosis not present

## 2018-08-09 DIAGNOSIS — S025XXA Fracture of tooth (traumatic), initial encounter for closed fracture: Secondary | ICD-10-CM | POA: Diagnosis not present

## 2018-08-09 DIAGNOSIS — S022XXA Fracture of nasal bones, initial encounter for closed fracture: Secondary | ICD-10-CM | POA: Diagnosis present

## 2018-08-09 DIAGNOSIS — N179 Acute kidney failure, unspecified: Secondary | ICD-10-CM | POA: Diagnosis present

## 2018-08-09 DIAGNOSIS — R55 Syncope and collapse: Secondary | ICD-10-CM | POA: Diagnosis present

## 2018-08-09 DIAGNOSIS — F1010 Alcohol abuse, uncomplicated: Secondary | ICD-10-CM | POA: Diagnosis present

## 2018-08-09 DIAGNOSIS — R42 Dizziness and giddiness: Secondary | ICD-10-CM

## 2018-08-09 DIAGNOSIS — S299XXA Unspecified injury of thorax, initial encounter: Secondary | ICD-10-CM | POA: Diagnosis not present

## 2018-08-09 DIAGNOSIS — S0240DA Maxillary fracture, left side, initial encounter for closed fracture: Secondary | ICD-10-CM | POA: Diagnosis not present

## 2018-08-09 DIAGNOSIS — S0990XA Unspecified injury of head, initial encounter: Secondary | ICD-10-CM | POA: Diagnosis not present

## 2018-08-09 DIAGNOSIS — W19XXXA Unspecified fall, initial encounter: Secondary | ICD-10-CM | POA: Diagnosis not present

## 2018-08-09 DIAGNOSIS — S0292XA Unspecified fracture of facial bones, initial encounter for closed fracture: Secondary | ICD-10-CM | POA: Diagnosis not present

## 2018-08-09 DIAGNOSIS — S0240BA Malar fracture, left side, initial encounter for closed fracture: Secondary | ICD-10-CM | POA: Diagnosis not present

## 2018-08-09 DIAGNOSIS — S0230XA Fracture of orbital floor, unspecified side, initial encounter for closed fracture: Secondary | ICD-10-CM | POA: Diagnosis present

## 2018-08-09 DIAGNOSIS — I10 Essential (primary) hypertension: Secondary | ICD-10-CM | POA: Diagnosis not present

## 2018-08-09 DIAGNOSIS — J45909 Unspecified asthma, uncomplicated: Secondary | ICD-10-CM | POA: Insufficient documentation

## 2018-08-09 DIAGNOSIS — W1789XA Other fall from one level to another, initial encounter: Secondary | ICD-10-CM | POA: Diagnosis not present

## 2018-08-09 DIAGNOSIS — S02401A Maxillary fracture, unspecified, initial encounter for closed fracture: Secondary | ICD-10-CM | POA: Diagnosis present

## 2018-08-09 DIAGNOSIS — E785 Hyperlipidemia, unspecified: Secondary | ICD-10-CM | POA: Diagnosis not present

## 2018-08-09 DIAGNOSIS — S199XXA Unspecified injury of neck, initial encounter: Secondary | ICD-10-CM | POA: Diagnosis not present

## 2018-08-09 DIAGNOSIS — S0232XA Fracture of orbital floor, left side, initial encounter for closed fracture: Secondary | ICD-10-CM

## 2018-08-09 DIAGNOSIS — S0219XA Other fracture of base of skull, initial encounter for closed fracture: Secondary | ICD-10-CM | POA: Diagnosis present

## 2018-08-09 LAB — URINALYSIS, ROUTINE W REFLEX MICROSCOPIC
BILIRUBIN URINE: NEGATIVE
Glucose, UA: NEGATIVE mg/dL
HGB URINE DIPSTICK: NEGATIVE
KETONES UR: 20 mg/dL — AB
Leukocytes, UA: NEGATIVE
Nitrite: NEGATIVE
PROTEIN: NEGATIVE mg/dL
Specific Gravity, Urine: 1.018 (ref 1.005–1.030)
pH: 5 (ref 5.0–8.0)

## 2018-08-09 LAB — BASIC METABOLIC PANEL
Anion gap: 16 — ABNORMAL HIGH (ref 5–15)
Anion gap: 12 (ref 5–15)
BUN: 19 mg/dL (ref 8–23)
BUN: 17 mg/dL (ref 8–23)
CALCIUM: 8.8 mg/dL — AB (ref 8.9–10.3)
CO2: 20 mmol/L — AB (ref 22–32)
CO2: 25 mmol/L (ref 22–32)
CREATININE: 1.3 mg/dL — AB (ref 0.61–1.24)
CREATININE: 1.01 mg/dL (ref 0.61–1.24)
Calcium: 8.8 mg/dL — ABNORMAL LOW (ref 8.9–10.3)
Chloride: 98 mmol/L (ref 98–111)
Chloride: 97 mmol/L — ABNORMAL LOW (ref 98–111)
GFR calc Af Amer: >60 mL/min (ref >60)
GFR calc non Af Amer: 53 mL/min — ABNORMAL LOW (ref >60)
GLUCOSE: 97 mg/dL (ref 70–99)
Glucose, Bld: 102 mg/dL — ABNORMAL HIGH (ref 70–99)
POTASSIUM: 4.8 mmol/L (ref 3.5–5.1)
Potassium: 3.7 mmol/L (ref 3.5–5.1)
SODIUM: 134 mmol/L — AB (ref 135–145)
Sodium: 134 mmol/L — ABNORMAL LOW (ref 135–145)

## 2018-08-09 LAB — CBC
HEMATOCRIT: 41.9 % (ref 39.0–52.0)
Hemoglobin: 14.2 g/dL (ref 13.0–17.0)
MCH: 33.8 pg (ref 26.0–34.0)
MCHC: 33.9 g/dL (ref 30.0–36.0)
MCV: 99.8 fL (ref 78.0–100.0)
Platelets: 258 10*3/uL (ref 150–400)
RBC: 4.2 MIL/uL — ABNORMAL LOW (ref 4.22–5.81)
RDW: 11.9 % (ref 11.5–15.5)
WBC: 11 10*3/uL — ABNORMAL HIGH (ref 4.0–10.5)

## 2018-08-09 LAB — RAPID URINE DRUG SCREEN, HOSP PERFORMED
Amphetamines: NOT DETECTED
Barbiturates: NOT DETECTED
Benzodiazepines: NOT DETECTED
Cocaine: NOT DETECTED
OPIATES: NOT DETECTED
Tetrahydrocannabinol: NOT DETECTED

## 2018-08-09 LAB — ECHOCARDIOGRAM COMPLETE
Height: 75 in
WEIGHTICAEL: 2896 [oz_av]

## 2018-08-09 LAB — GLUCOSE, CAPILLARY: Glucose-Capillary: 83 mg/dL (ref 70–99)

## 2018-08-09 LAB — ETHANOL: Alcohol, Ethyl (B): 193 mg/dL — ABNORMAL HIGH (ref <10)

## 2018-08-09 MED ORDER — VITAMIN B-1 100 MG PO TABS
100.0000 mg | ORAL_TABLET | Freq: Every day | ORAL | Status: DC
  Administered 2018-08-09 – 2018-08-10 (×2): 100 mg via ORAL
  Filled 2018-08-09 (×2): qty 1

## 2018-08-09 MED ORDER — ONDANSETRON HCL 4 MG PO TABS: 4.0000 mg | ORAL_TABLET | Freq: Four times a day (QID) | ORAL | Status: DC | PRN

## 2018-08-09 MED ORDER — AMLODIPINE BESYLATE 5 MG PO TABS
5.0000 mg | ORAL_TABLET | Freq: Every day | ORAL | Status: DC
  Administered 2018-08-09 – 2018-08-10 (×2): 5 mg via ORAL
  Filled 2018-08-09 (×2): qty 1

## 2018-08-09 MED ORDER — FOLIC ACID 1 MG PO TABS
1.0000 mg | ORAL_TABLET | Freq: Every day | ORAL | Status: DC
  Administered 2018-08-09 – 2018-08-10 (×2): 1 mg via ORAL
  Filled 2018-08-09 (×2): qty 1

## 2018-08-09 MED ORDER — ACETAMINOPHEN 650 MG RE SUPP: 650.0000 mg | Freq: Four times a day (QID) | RECTAL | Status: DC | PRN

## 2018-08-09 MED ORDER — SODIUM CHLORIDE 0.9% FLUSH
3.0000 mL | Freq: Two times a day (BID) | INTRAVENOUS | Status: DC
  Administered 2018-08-10: 3 mL via INTRAVENOUS

## 2018-08-09 MED ORDER — SENNOSIDES-DOCUSATE SODIUM 8.6-50 MG PO TABS: 1.0000 | ORAL_TABLET | Freq: Every evening | ORAL | Status: DC | PRN

## 2018-08-09 MED ORDER — ASPIRIN 81 MG PO CHEW
81.0000 mg | CHEWABLE_TABLET | Freq: Every day | ORAL | Status: DC
  Administered 2018-08-09 – 2018-08-10 (×2): 81 mg via ORAL
  Filled 2018-08-09 (×2): qty 1

## 2018-08-09 MED ORDER — SODIUM CHLORIDE 0.9 % IV BOLUS
1000.0000 mL | Freq: Once | INTRAVENOUS | Status: AC
  Administered 2018-08-09: 1000 mL via INTRAVENOUS

## 2018-08-09 MED ORDER — ACETAMINOPHEN 325 MG PO TABS: 650.0000 mg | ORAL_TABLET | Freq: Four times a day (QID) | ORAL | Status: DC | PRN

## 2018-08-09 MED ORDER — CEPHALEXIN 250 MG PO CAPS
500.0000 mg | ORAL_CAPSULE | Freq: Three times a day (TID) | ORAL | Status: DC
  Administered 2018-08-09 – 2018-08-10 (×5): 500 mg via ORAL
  Filled 2018-08-09 (×5): qty 2

## 2018-08-09 MED ORDER — ALBUTEROL SULFATE (2.5 MG/3ML) 0.083% IN NEBU: 2.5000 mg | INHALATION_SOLUTION | RESPIRATORY_TRACT | Status: DC | PRN

## 2018-08-09 MED ORDER — SODIUM CHLORIDE 0.9 % IV SOLN
INTRAVENOUS | Status: DC
  Administered 2018-08-09 – 2018-08-10 (×3): via INTRAVENOUS

## 2018-08-09 MED ORDER — HYDRALAZINE HCL 20 MG/ML IJ SOLN: 5.0000 mg | INTRAMUSCULAR | Status: DC | PRN

## 2018-08-09 MED ORDER — ADULT MULTIVITAMIN W/MINERALS CH
1.0000 | ORAL_TABLET | Freq: Every day | ORAL | Status: DC
  Administered 2018-08-09 – 2018-08-10 (×2): 1 via ORAL
  Filled 2018-08-09 (×2): qty 1

## 2018-08-09 MED ORDER — ONDANSETRON HCL 4 MG/2ML IJ SOLN: 4.0000 mg | Freq: Four times a day (QID) | INTRAMUSCULAR | Status: DC | PRN

## 2018-08-09 MED ORDER — NIACIN 500 MG PO TABS
500.0000 mg | ORAL_TABLET | Freq: Every day | ORAL | Status: DC
  Administered 2018-08-09 – 2018-08-10 (×2): 500 mg via ORAL
  Filled 2018-08-09 (×2): qty 1

## 2018-08-09 MED ORDER — LORAZEPAM 2 MG/ML IJ SOLN: 0.0000 mg | Freq: Four times a day (QID) | INTRAMUSCULAR | Status: DC

## 2018-08-09 MED ORDER — LORAZEPAM 2 MG/ML IJ SOLN: 1.0000 mg | Freq: Four times a day (QID) | INTRAMUSCULAR | Status: DC | PRN

## 2018-08-09 MED ORDER — ZOLPIDEM TARTRATE 5 MG PO TABS: 5.0000 mg | ORAL_TABLET | Freq: Every evening | ORAL | Status: DC | PRN

## 2018-08-09 MED ORDER — OXYCODONE-ACETAMINOPHEN 5-325 MG PO TABS: 1.0000 | ORAL_TABLET | ORAL | Status: DC | PRN

## 2018-08-09 MED ORDER — THIAMINE HCL 100 MG/ML IJ SOLN: 100.0000 mg | Freq: Every day | INTRAMUSCULAR | Status: DC

## 2018-08-09 MED ORDER — MECLIZINE HCL 25 MG PO TABS: 25.0000 mg | ORAL_TABLET | Freq: Three times a day (TID) | ORAL | Status: DC | PRN

## 2018-08-09 MED ORDER — LORAZEPAM 1 MG PO TABS: 1.0000 mg | ORAL_TABLET | Freq: Four times a day (QID) | ORAL | Status: DC | PRN

## 2018-08-09 MED ORDER — BACITRACIN ZINC 500 UNIT/GM EX OINT
TOPICAL_OINTMENT | Freq: Two times a day (BID) | CUTANEOUS | Status: DC
  Administered 2018-08-09 – 2018-08-10 (×3): via TOPICAL
  Filled 2018-08-09: qty 28.4

## 2018-08-09 MED ORDER — LORAZEPAM 2 MG/ML IJ SOLN: 0.0000 mg | Freq: Two times a day (BID) | INTRAMUSCULAR | Status: DC

## 2018-08-09 NOTE — Progress Notes (Signed)
PROGRESS NOTE   JURGEN GROENEVELD  NID:[id number]    DOB: Dec 09, 1945    DOA: 08/08/2018  PCP: Burnard Bunting, MD   I have briefly reviewed patients previous medical records in [facility].  Brief Narrative:  72 year old male with PMH of HTN, HLD, asthma, OSA not on CPAP, vertigo, PVCs, alcohol dependence, presented to [facility] ED on 08/08/2018 after syncope and fall at [facility].  He had been drinking alcohol.  Blood alcohol level 193.  CT head and neck were negative for acute findings but maxillofacial CT showed multiple fractures.  Admitted for syncope, alcohol intoxication and concern for withdrawal, facial fractures.  ENT consulted.   Assessment & Plan:   Principal Problem:   Syncope Active Problems:   Vertigo   HTN (hypertension)   Hyperlipidemia   AKI (acute kidney injury) ([place])   Fall   Fracture of maxilla (HCC)   Nasal bone fractures   Pterygoid plate fracture (HCC)   Orbital floor fracture (HCC)   Fracture of incisor teeth   Alcohol abuse   Syncope: Possibly due to alcohol intoxication.  Other DD include: Orthostatic hypotension related to dehydration, arrhythmias, vasovagal, vertigo.  Seizure seems less likely.  Stroke ruled out by MRI.  Admitted to telemetry: Sinus rhythm without arrhythmias except occasional PVCs.  MRI brain: No acute abnormalities.  TTE 9/21: LVEF 55-60% and grade 1 diastolic dysfunction.  Check orthostatic blood pressures.  Mobilize with PT.  Vertigo: Reportedly due to labyrinthitis.  Apparently had improved and then recurred.  Follows with Dr. Jaynee Eagles, neurology.  Continue PRN meclizine.  Essential hypertension: Mildly uncontrolled.  Diovan held due to acute kidney injury.  Amlodipine 5 mg daily started.  Monitor.  Hyperlipidemia: Continue niacin.  Acute kidney injury: Secondary to dehydration and ARB.  Diovan held.  IV fluids.  Resolved.  Fall with multiple facial fractures: ENT consultation appreciated.  Nondisplaced fractures of  left zygoma, pterygoid plates, nasal septum, displaced nasal fracture.  Conservative management as outlined by ENT, ophthalmology appointment as outpatient, follow-up with ENT in 4 to 5 days and may need closed reduction of nasal fracture with stabilization.  Alcohol dependence: Reportedly drinks 1.5 bottles of wine every day.  At risk for DTs. CIWA protocol and close monitoring.   DVT prophylaxis: SCD Code Status: Full Family Communication: None at bedside Disposition: To be determined pending clinical improvement and PT evaluation.   Consultants:  ENT  Procedures:  None  Antimicrobials:  Keflex   Subjective: Seen this morning.  Feels better.  Mild pain over left cheek but otherwise no significant pain.  No difficulty breathing.  States that he was walking from the event to the car to leave early from the event when he passed out.  He had no premonitory symptoms and does not recollect the event.  When he woke up paramedics were at his bedside.  Gives history of?  Presyncope/fall couple of weeks ago.  Reportedly has unsteady gait.  He reports some left lower rib cage pain to touch and deep inspiration.  ROS: As above.  Objective:  Vitals:   08/09/18 0546 08/09/18 0552 08/09/18 0937 08/09/18 1546  BP: (!) 139/91 113/83 (!) 145/84 (!) 144/93  Pulse: 85 (!) 109  84  Resp:    18  Temp:    98.6 F (37 C)  TempSrc:    Oral  SpO2:    97%  Weight:      Height:        Examination:  General exam:  Pleasant elderly male, moderately built and nourished, sitting up comfortably in bed. Respiratory system: Clear to auscultation. Respiratory effort normal. Cardiovascular system: S1 & S2 heard, RRR. No JVD, murmurs, rubs, gallops or clicks. No pedal edema.  Telemetry personally reviewed: Sinus rhythm with occasional PVCs. Gastrointestinal system: Abdomen is nondistended, soft and nontender. No organomegaly or masses felt. Normal bowel sounds heard. Central nervous system: Alert and  oriented. No focal neurological deficits. Extremities: Symmetric 5 x 5 power. Skin: No rashes, lesions or ulcers Psychiatry: Judgement and insight appear normal. Mood & affect appropriate.  HEENT: Multiple areas of facial bruising/abrasions.  No slightly displaced to left.  Missing anterior upper teeth.   Data Reviewed: I have personally reviewed following labs and imaging studies  CBC: Recent Labs  Lab 08/08/18 2326 08/09/18 0551  WBC 6.9 11.0*  HGB 13.7 14.2  HCT 41.0 41.9  MCV 101.2* 99.8  PLT 197 387   Basic Metabolic Panel: Recent Labs  Lab 08/08/18 2326 08/09/18 0551  NA 134* 134*  K 3.7 4.8  CL 98 97*  CO2 20* 25  GLUCOSE 102* 97  BUN 19 17  CREATININE 1.30* 1.01  CALCIUM 8.8* 8.8*    CBG: Recent Labs  Lab 08/09/18 0839  GLUCAP 83    No results found for this or any previous visit (from the past 240 hour(s)).       Radiology Studies: Dg Chest 2 View  Result Date: 08/09/2018 CLINICAL DATA:  Syncope, fall, left rib pain EXAM: CHEST - 2 VIEW COMPARISON:  None. FINDINGS: Lungs are clear.  No pleural effusion or pneumothorax. The heart is normal in size. Visualized osseous structures are within normal limits. IMPRESSION: Normal chest radiographs. Electronically Signed   By: Julian Hy M.D.   On: 08/09/2018 10:09   Dg Ribs Unilateral Left  Result Date: 08/09/2018 CLINICAL DATA:  Syncope, fall, left rib pain EXAM: LEFT RIBS - 2 VIEW COMPARISON:  None. FINDINGS: No displaced left rib fracture is seen. Visualized left lung is clear. IMPRESSION: No displaced left rib fracture is seen. Electronically Signed   By: Julian Hy M.D.   On: 08/09/2018 10:09   Ct Head Wo Contrast  Result Date: 08/09/2018 CLINICAL DATA:  Maxillofacial trauma after syncopal episode. Avulsed front tooth and laceration above lip. EXAM: CT HEAD WITHOUT CONTRAST CT MAXILLOFACIAL WITHOUT CONTRAST CT CERVICAL SPINE WITHOUT CONTRAST TECHNIQUE: Multidetector CT imaging of the head,  cervical spine, and maxillofacial structures were performed using the standard protocol without intravenous contrast. Multiplanar CT image reconstructions of the cervical spine and maxillofacial structures were also generated. COMPARISON:  None. FINDINGS: CT HEAD FINDINGS Brain: Age related involutional changes of the brain without acute intracranial hemorrhage, midline shift or edema. No large vascular territory infarction. No hydrocephalus. No intra-axial mass nor extra-axial fluid. Vascular: No hyperdense vessel sign. Minimal atherosclerosis of the skull base. Skull: No acute skull fracture. Please see the maxillofacial CT report below for details on facial fractures. Other: Soft tissue swelling of the upper lip. CT MAXILLOFACIAL FINDINGS Osseous: The following acute fractures are identified: 1. Bilateral nasal bone fractures with minimal displacement of the tip of the right nasal bone laterally. 2. Fractures of the anterior, lateral and medial wall of the left maxillary sinus with minimal posterior displacement of the anterior wall fracture fragment by 3 mm. 3. Fracture at the junction of the left zygomatic arch and maxillary sinus, series 10/49. The remainder of the zygomatic arch appears intact. 4. Fractures of the lateral pterygoid plates bilaterally. 5.  Fracture of the lateral wall as well as orbital floor of the left orbit without significant displacement. 6. Avulsed left upper central incisor. 7. Fracture of the nasal septum along its superior aspect, series 14/39. Orbits: Intact optic nerves, extraocular muscles and globes. No lens did attachments. Sinuses: Blood products opacifying much of the left maxillary sinus and moderately opacifying the ethmoid sinus. The right maxillary and sphenoid sinuses are clear as is the frontal sinus. Soft tissues: Soft tissue swelling of the upper lip. Mild left supraorbital soft tissue swelling. CT CERVICAL SPINE FINDINGS Alignment: Slight straightening of the cervical  spine possibly positional or due to muscle spasm. Skull base and vertebrae: No acute fracture. No primary bone lesion or focal pathologic process. Soft tissues and spinal canal: No prevertebral fluid or swelling. No visible canal hematoma. Disc levels: Moderate disc flattening C4 through C7 with posterior marginal osteophytes and uncovertebral joint osteoarthritis. Multilevel degenerative facet arthropathy. No significant central or foraminal encroachment. Upper chest: Negative. Other: None IMPRESSION: CT head: No acute intracranial abnormality. CT maxillofacial: Upper lip and supraorbital soft tissue swelling associated with fractures as below: 1. Bilateral nasal bone fractures with minimal displacement of the tip of the right nasal bone laterally. 2. Fractures of the anterior, lateral and medial wall of the left maxillary sinus with minimal posterior displacement of the anterior wall fracture fragment by 3 mm. 3. Fracture at the junction of the left zygomatic arch and maxillary sinus, series 10/49. The remainder of the zygomatic arch appears intact. 4. Fractures of the lateral pterygoid plates bilaterally. 5. Fracture of the lateral wall as well as orbital floor of the left orbit without significant displacement. 6. Avulsed left upper central incisor. 7. Fracture of the nasal septum along its superior aspect, series 14/39. Posttraumatic opacification of the left maxillary sinus with blood products as well as moderate opacification of the ethmoid sinus. CT cervical spine: No acute cervical spine fracture. Degenerative disc disease C4 through C7. Electronically Signed   By: Ashley Royalty M.D.   On: 08/09/2018 00:40   Ct Cervical Spine Wo Contrast  Result Date: 08/09/2018 CLINICAL DATA:  Maxillofacial trauma after syncopal episode. Avulsed front tooth and laceration above lip. EXAM: CT HEAD WITHOUT CONTRAST CT MAXILLOFACIAL WITHOUT CONTRAST CT CERVICAL SPINE WITHOUT CONTRAST TECHNIQUE: Multidetector CT imaging of  the head, cervical spine, and maxillofacial structures were performed using the standard protocol without intravenous contrast. Multiplanar CT image reconstructions of the cervical spine and maxillofacial structures were also generated. COMPARISON:  None. FINDINGS: CT HEAD FINDINGS Brain: Age related involutional changes of the brain without acute intracranial hemorrhage, midline shift or edema. No large vascular territory infarction. No hydrocephalus. No intra-axial mass nor extra-axial fluid. Vascular: No hyperdense vessel sign. Minimal atherosclerosis of the skull base. Skull: No acute skull fracture. Please see the maxillofacial CT report below for details on facial fractures. Other: Soft tissue swelling of the upper lip. CT MAXILLOFACIAL FINDINGS Osseous: The following acute fractures are identified: 1. Bilateral nasal bone fractures with minimal displacement of the tip of the right nasal bone laterally. 2. Fractures of the anterior, lateral and medial wall of the left maxillary sinus with minimal posterior displacement of the anterior wall fracture fragment by 3 mm. 3. Fracture at the junction of the left zygomatic arch and maxillary sinus, series 10/49. The remainder of the zygomatic arch appears intact. 4. Fractures of the lateral pterygoid plates bilaterally. 5. Fracture of the lateral wall as well as orbital floor of the left orbit without significant displacement.  6. Avulsed left upper central incisor. 7. Fracture of the nasal septum along its superior aspect, series 14/39. Orbits: Intact optic nerves, extraocular muscles and globes. No lens did attachments. Sinuses: Blood products opacifying much of the left maxillary sinus and moderately opacifying the ethmoid sinus. The right maxillary and sphenoid sinuses are clear as is the frontal sinus. Soft tissues: Soft tissue swelling of the upper lip. Mild left supraorbital soft tissue swelling. CT CERVICAL SPINE FINDINGS Alignment: Slight straightening of the  cervical spine possibly positional or due to muscle spasm. Skull base and vertebrae: No acute fracture. No primary bone lesion or focal pathologic process. Soft tissues and spinal canal: No prevertebral fluid or swelling. No visible canal hematoma. Disc levels: Moderate disc flattening C4 through C7 with posterior marginal osteophytes and uncovertebral joint osteoarthritis. Multilevel degenerative facet arthropathy. No significant central or foraminal encroachment. Upper chest: Negative. Other: None IMPRESSION: CT head: No acute intracranial abnormality. CT maxillofacial: Upper lip and supraorbital soft tissue swelling associated with fractures as below: 1. Bilateral nasal bone fractures with minimal displacement of the tip of the right nasal bone laterally. 2. Fractures of the anterior, lateral and medial wall of the left maxillary sinus with minimal posterior displacement of the anterior wall fracture fragment by 3 mm. 3. Fracture at the junction of the left zygomatic arch and maxillary sinus, series 10/49. The remainder of the zygomatic arch appears intact. 4. Fractures of the lateral pterygoid plates bilaterally. 5. Fracture of the lateral wall as well as orbital floor of the left orbit without significant displacement. 6. Avulsed left upper central incisor. 7. Fracture of the nasal septum along its superior aspect, series 14/39. Posttraumatic opacification of the left maxillary sinus with blood products as well as moderate opacification of the ethmoid sinus. CT cervical spine: No acute cervical spine fracture. Degenerative disc disease C4 through C7. Electronically Signed   By: Ashley Royalty M.D.   On: 08/09/2018 00:40   Mr Brain Wo Contrast  Result Date: 08/09/2018 CLINICAL DATA:  72 year old male with facial trauma after syncopal episode and fall. EXAM: MRI HEAD WITHOUT CONTRAST TECHNIQUE: Multiplanar, multiecho pulse sequences of the brain and surrounding structures were obtained without intravenous  contrast. COMPARISON:  Head face and cervical spine CTs 08/08/2018. Brain MRI 01/15/2018. FINDINGS: Brain: No restricted diffusion to suggest acute infarction. No midline shift, mass effect, evidence of mass lesion, ventriculomegaly, extra-axial collection or acute intracranial hemorrhage. Cervicomedullary junction and pituitary are within normal limits. Mild to moderate for age scattered and occasionally patchy cerebral white matter T2 and FLAIR hyperintensity appears stable since February. No cortical encephalomalacia or chronic cerebral blood products identified. Deep gray matter nuclei, brainstem, and cerebellum appear normal for age. Vascular: Major intracranial vascular flow voids are stable and appear normal. Skull and upper cervical spine: Negative visible cervical spine. Normal bone marrow signal. Sinuses/Orbits: Intraorbital soft tissues appear stable and normal. There is a hemorrhage level in the left maxillary sinus. The remaining paranasal sinuses are well pneumatized. Other: Mastoid air cells remain clear. Visible internal auditory structures appear normal. IMPRESSION: 1.  No acute intracranial abnormality. Stable MRI appearance of the brain since February with mild to moderate for age nonspecific cerebral white matter signal changes. 2. Layering hemorrhage in the left maxillary sinus related to facial fractures demonstrated yesterday. Electronically Signed   By: Genevie Ann M.D.   On: 08/09/2018 10:43   Ct Maxillofacial Wo Contrast  Result Date: 08/09/2018 CLINICAL DATA:  Maxillofacial trauma after syncopal episode. Avulsed front tooth and  laceration above lip. EXAM: CT HEAD WITHOUT CONTRAST CT MAXILLOFACIAL WITHOUT CONTRAST CT CERVICAL SPINE WITHOUT CONTRAST TECHNIQUE: Multidetector CT imaging of the head, cervical spine, and maxillofacial structures were performed using the standard protocol without intravenous contrast. Multiplanar CT image reconstructions of the cervical spine and maxillofacial  structures were also generated. COMPARISON:  None. FINDINGS: CT HEAD FINDINGS Brain: Age related involutional changes of the brain without acute intracranial hemorrhage, midline shift or edema. No large vascular territory infarction. No hydrocephalus. No intra-axial mass nor extra-axial fluid. Vascular: No hyperdense vessel sign. Minimal atherosclerosis of the skull base. Skull: No acute skull fracture. Please see the maxillofacial CT report below for details on facial fractures. Other: Soft tissue swelling of the upper lip. CT MAXILLOFACIAL FINDINGS Osseous: The following acute fractures are identified: 1. Bilateral nasal bone fractures with minimal displacement of the tip of the right nasal bone laterally. 2. Fractures of the anterior, lateral and medial wall of the left maxillary sinus with minimal posterior displacement of the anterior wall fracture fragment by 3 mm. 3. Fracture at the junction of the left zygomatic arch and maxillary sinus, series 10/49. The remainder of the zygomatic arch appears intact. 4. Fractures of the lateral pterygoid plates bilaterally. 5. Fracture of the lateral wall as well as orbital floor of the left orbit without significant displacement. 6. Avulsed left upper central incisor. 7. Fracture of the nasal septum along its superior aspect, series 14/39. Orbits: Intact optic nerves, extraocular muscles and globes. No lens did attachments. Sinuses: Blood products opacifying much of the left maxillary sinus and moderately opacifying the ethmoid sinus. The right maxillary and sphenoid sinuses are clear as is the frontal sinus. Soft tissues: Soft tissue swelling of the upper lip. Mild left supraorbital soft tissue swelling. CT CERVICAL SPINE FINDINGS Alignment: Slight straightening of the cervical spine possibly positional or due to muscle spasm. Skull base and vertebrae: No acute fracture. No primary bone lesion or focal pathologic process. Soft tissues and spinal canal: No prevertebral  fluid or swelling. No visible canal hematoma. Disc levels: Moderate disc flattening C4 through C7 with posterior marginal osteophytes and uncovertebral joint osteoarthritis. Multilevel degenerative facet arthropathy. No significant central or foraminal encroachment. Upper chest: Negative. Other: None IMPRESSION: CT head: No acute intracranial abnormality. CT maxillofacial: Upper lip and supraorbital soft tissue swelling associated with fractures as below: 1. Bilateral nasal bone fractures with minimal displacement of the tip of the right nasal bone laterally. 2. Fractures of the anterior, lateral and medial wall of the left maxillary sinus with minimal posterior displacement of the anterior wall fracture fragment by 3 mm. 3. Fracture at the junction of the left zygomatic arch and maxillary sinus, series 10/49. The remainder of the zygomatic arch appears intact. 4. Fractures of the lateral pterygoid plates bilaterally. 5. Fracture of the lateral wall as well as orbital floor of the left orbit without significant displacement. 6. Avulsed left upper central incisor. 7. Fracture of the nasal septum along its superior aspect, series 14/39. Posttraumatic opacification of the left maxillary sinus with blood products as well as moderate opacification of the ethmoid sinus. CT cervical spine: No acute cervical spine fracture. Degenerative disc disease C4 through C7. Electronically Signed   By: Ashley Royalty M.D.   On: 08/09/2018 00:40        Scheduled Meds: . amLODipine  5 mg Oral Daily  . aspirin  81 mg Oral Daily  . bacitracin   Topical BID  . cephALEXin  500 mg Oral Q8H  .  folic acid  1 mg Oral Daily  . LORazepam  0-4 mg Intravenous Q6H   Followed by  . [START ON 08/11/2018] LORazepam  0-4 mg Intravenous Q12H  . multivitamin with minerals  1 tablet Oral Daily  . niacin  500 mg Oral Q breakfast  . sodium chloride flush  3 mL Intravenous Q12H  . thiamine  100 mg Oral Daily   Or  . thiamine  100 mg  Intravenous Daily   Continuous Infusions: . sodium chloride 100 mL/hr at 08/09/18 1526     LOS: 0 days     Vernell Leep, MD, FACP, [facility]. [facility] Pager [phone]  If 7PM-7AM, please contact night-coverage [email or web address] Password [facility] 08/09/2018, 5:28 PM

## 2018-08-09 NOTE — H&P (Signed)
History and Physical    Jordan Hicks:[id number] DOB: January 12, 1946 DOA: 08/08/2018  Referring MD/NP/PA:   PCP: Burnard Bunting, MD   Patient coming from:  The patient is coming from home.  At baseline, pt is independent for most of ADL.   Chief Complaint: Syncope, fall  HPI: Jordan Hicks is a 72 y.o. 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  Patient states that he passed out at about 10:00 p.m. when he was walking after drank 4 glasses of wine.  He lost consciousness, but does not know how long it lasted.  He does not remember what happened to him.  Currently patient denies any unilateral weakness, numbness or tingling his extremities.  No facial droop or slurred speech.  No vision change or hearing loss.  He has skin abrasion in left face, skin tear in left upper lip and left elbow.  No active bleeding.  He has moderate pain in left face, nose and left lower chest.  No shortness of breath, cough, fever or chills.  Denies nausea, vomiting, diarrhea or abdominal pain.  No symptoms of UTI.   ED Course: pt was found to have WBC 6.9, negative troponin, alcohol level 193, AKI with creatinine 1.30, BUN 19, temperature 97.5, no tachycardia, no tachypnea, oxygen saturation 97% on room air.  CT head is negative for acute intracranial abnormalities.  CT of maxillofacial image showed multiple facial bone fracture, including nasal bone, maxillary, orbital, Pterygoid plate and incisor tooth fracture.  CT-C spin showed: 1. Slight straightening of the cervical spine possibly positional or due to muscle spasm. 2. No acute fracture. No primary bone lesion or focal pathologic process.  3. Moderate disc flattening C4 through C7 with posterior marginal osteophytes and uncovertebral joint osteoarthritis. Multilevel degenerative facet arthropathy. No significant central or  foraminal encroachment.  Review of Systems:   General: no fevers, chills, no body weight gain, has fatigue. Has facial pain. HEENT: no blurry vision, hearing changes or sore throat Respiratory: no dyspnea, coughing, wheezing CV: no chest pain, no palpitations GI: no nausea, vomiting, abdominal pain, diarrhea, constipation GU: no dysuria, burning on urination, increased urinary frequency, hematuria  Ext: no leg edema Neuro: no unilateral weakness, numbness, or tingling, no vision change or hearing loss. Had syncope and fall. Skin: no rash, has multiple skin tear. MSK: No muscle spasm, no deformity, no limitation of range of movement in spin. Has left lower chest wall pain. Heme: No easy bruising.  Travel history: No recent long distant travel.  Allergy: No Known Allergies  Past Medical History:  Diagnosis Date  . Arthritis    lower back  . Asthma    childhood  . HTN (hypertension)   . Hyperlipidemia   . Sleep apnea    as stated by wife    Past Surgical History:  Procedure Laterality Date  . BICEPS TENDON REPAIR Left   . COLONOSCOPY  2007  . HERNIA REPAIR    . THROAT SURGERY     About 10-78yrs ago    Social History:  reports that he has never smoked. He has never used smokeless tobacco. He reports that he drinks alcohol. He reports that he does not use drugs.  Family History:  Family History  Problem Relation Age of Onset  . Emphysema Mother   . Heart attack Father   . Colon cancer Neg Hx      Prior  to Admission medications   Medication Sig Start Date End Date Taking? Authorizing Provider  aspirin 81 MG tablet Take 81 mg by mouth daily.   Yes [provider]  Multiple Vitamin (MULTIVITAMIN WITH MINERALS) TABS tablet Take 1 tablet by mouth daily.   Yes [provider]  niacin 500 MG tablet Take 500 mg by mouth daily with breakfast.   Yes [provider]  valsartan (DIOVAN) 160 MG tablet Take 160 mg by mouth daily. 06/02/18  Yes [provider]  methylPREDNISolone (MEDROL DOSEPAK) 4 MG TBPK tablet follow package directions Patient not taking: Reported on 08/09/2018 01/06/18   Melvenia Beam, MD    Physical Exam: Vitals:   08/09/18 0530 08/09/18 0540 08/09/18 0546 08/09/18 0552  BP:  134/76 (!) 139/91 113/83  Pulse:  73 85 (!) 109  Resp:      Temp:      TempSrc:      SpO2:      Weight: 82.1 kg     Height: 6\' 3"  (1.905 m)      General: Not in acute distress.  Dry mucous and membrane HEENT: Avulsed left upper central incisor.       Eyes: PERRL, EOMI, no scleral icterus.       ENT: No discharge from the ears and nose, no pharynx injection, no tonsillar enlargement. Has tenderness in left face and nose       Neck: No JVD, no bruit, no mass felt. Heme: No neck lymph node enlargement. Cardiac: S1/S2, RRR, No murmurs, No gallops or rubs. Respiratory:  No rales, wheezing, rhonchi or rubs. Chest wall: has tenderness in left lower chest wall. GI: Soft, nondistended, nontender, no rebound pain, no organomegaly, BS present. GU: No hematuria Ext: No pitting leg edema bilaterally. 2+DP/PT pulse bilaterally. Musculoskeletal: No joint deformities, No joint redness or warmth, no limitation of ROM in spin. Skin: No rashes. He has skin abrasion in left face, skin tear in left upper lip and left elbow Neuro: Alert, oriented X3, cranial nerves II-XII grossly intact, moves all extremities normally.  Psych: Patient is not psychotic, no suicidal or hemocidal ideation.  Labs on Admission: I have personally reviewed following labs and imaging studies  CBC: Recent Labs  Lab 08/08/18 2326  WBC 6.9  HGB 13.7  HCT 41.0  MCV 101.2*  PLT 326   Basic Metabolic Panel: Recent Labs  Lab 08/08/18 2326  NA 134*  K 3.7  CL 98  CO2 20*  GLUCOSE 102*  BUN 19  CREATININE 1.30*  CALCIUM 8.8*   GFR: Estimated Creatinine Clearance: 58.8 mL/min (A) (by C-G formula based on SCr of 1.3 mg/dL (H)). Liver Function Tests: No  results for input(s): AST, ALT, ALKPHOS, BILITOT, PROT, ALBUMIN in the last 168 hours. No results for input(s): LIPASE, AMYLASE in the last 168 hours. No results for input(s): AMMONIA in the last 168 hours. Coagulation Profile: No results for input(s): INR, PROTIME in the last 168 hours. Cardiac Enzymes: No results for input(s): CKTOTAL, CKMB, CKMBINDEX, TROPONINI in the last 168 hours. BNP (last 3 results) No results for input(s): PROBNP in the last 8760 hours. HbA1C: No results for input(s): HGBA1C in the last 72 hours. CBG: No results for input(s): GLUCAP in the last 168 hours. Lipid Profile: No results for input(s): CHOL, HDL, LDLCALC, TRIG, CHOLHDL, LDLDIRECT in the last 72 hours. Thyroid Function Tests: No results for input(s): TSH, T4TOTAL, FREET4, T3FREE, THYROIDAB in the last 72 hours. Anemia Panel: No results for  input(s): VITAMINB12, FOLATE, FERRITIN, TIBC, IRON, RETICCTPCT in the last 72 hours. Urine analysis: No results found for: COLORURINE, APPEARANCEUR, LABSPEC, PHURINE, GLUCOSEU, HGBUR, BILIRUBINUR, KETONESUR, PROTEINUR, UROBILINOGEN, NITRITE, LEUKOCYTESUR Sepsis Labs: @LABRCNTIP (procalcitonin:4,lacticidven:4) )No results found for this or any previous visit (from the past 240 hour(s)).   Radiological Exams on Admission: Ct Head Wo Contrast  Result Date: 08/09/2018 CLINICAL DATA:  Maxillofacial trauma after syncopal episode. Avulsed front tooth and laceration above lip. EXAM: CT HEAD WITHOUT CONTRAST CT MAXILLOFACIAL WITHOUT CONTRAST CT CERVICAL SPINE WITHOUT CONTRAST TECHNIQUE: Multidetector CT imaging of the head, cervical spine, and maxillofacial structures were performed using the standard protocol without intravenous contrast. Multiplanar CT image reconstructions of the cervical spine and maxillofacial structures were also generated. COMPARISON:  None. FINDINGS: CT HEAD FINDINGS Brain: Age related involutional changes of the brain without acute intracranial  hemorrhage, midline shift or edema. No large vascular territory infarction. No hydrocephalus. No intra-axial mass nor extra-axial fluid. Vascular: No hyperdense vessel sign. Minimal atherosclerosis of the skull base. Skull: No acute skull fracture. Please see the maxillofacial CT report below for details on facial fractures. Other: Soft tissue swelling of the upper lip. CT MAXILLOFACIAL FINDINGS Osseous: The following acute fractures are identified: 1. Bilateral nasal bone fractures with minimal displacement of the tip of the right nasal bone laterally. 2. Fractures of the anterior, lateral and medial wall of the left maxillary sinus with minimal posterior displacement of the anterior wall fracture fragment by 3 mm. 3. Fracture at the junction of the left zygomatic arch and maxillary sinus, series 10/49. The remainder of the zygomatic arch appears intact. 4. Fractures of the lateral pterygoid plates bilaterally. 5. Fracture of the lateral wall as well as orbital floor of the left orbit without significant displacement. 6. Avulsed left upper central incisor. 7. Fracture of the nasal septum along its superior aspect, series 14/39. Orbits: Intact optic nerves, extraocular muscles and globes. No lens did attachments. Sinuses: Blood products opacifying much of the left maxillary sinus and moderately opacifying the ethmoid sinus. The right maxillary and sphenoid sinuses are clear as is the frontal sinus. Soft tissues: Soft tissue swelling of the upper lip. Mild left supraorbital soft tissue swelling. CT CERVICAL SPINE FINDINGS Alignment: Slight straightening of the cervical spine possibly positional or due to muscle spasm. Skull base and vertebrae: No acute fracture. No primary bone lesion or focal pathologic process. Soft tissues and spinal canal: No prevertebral fluid or swelling. No visible canal hematoma. Disc levels: Moderate disc flattening C4 through C7 with posterior marginal osteophytes and uncovertebral joint  osteoarthritis. Multilevel degenerative facet arthropathy. No significant central or foraminal encroachment. Upper chest: Negative. Other: None IMPRESSION: CT head: No acute intracranial abnormality. CT maxillofacial: Upper lip and supraorbital soft tissue swelling associated with fractures as below: 1. Bilateral nasal bone fractures with minimal displacement of the tip of the right nasal bone laterally. 2. Fractures of the anterior, lateral and medial wall of the left maxillary sinus with minimal posterior displacement of the anterior wall fracture fragment by 3 mm. 3. Fracture at the junction of the left zygomatic arch and maxillary sinus, series 10/49. The remainder of the zygomatic arch appears intact. 4. Fractures of the lateral pterygoid plates bilaterally. 5. Fracture of the lateral wall as well as orbital floor of the left orbit without significant displacement. 6. Avulsed left upper central incisor. 7. Fracture of the nasal septum along its superior aspect, series 14/39. Posttraumatic opacification of the left maxillary sinus with blood products as well as moderate  opacification of the ethmoid sinus. CT cervical spine: No acute cervical spine fracture. Degenerative disc disease C4 through C7. Electronically Signed   By: Ashley Royalty M.D.   On: 08/09/2018 00:40   Ct Cervical Spine Wo Contrast  Result Date: 08/09/2018 CLINICAL DATA:  Maxillofacial trauma after syncopal episode. Avulsed front tooth and laceration above lip. EXAM: CT HEAD WITHOUT CONTRAST CT MAXILLOFACIAL WITHOUT CONTRAST CT CERVICAL SPINE WITHOUT CONTRAST TECHNIQUE: Multidetector CT imaging of the head, cervical spine, and maxillofacial structures were performed using the standard protocol without intravenous contrast. Multiplanar CT image reconstructions of the cervical spine and maxillofacial structures were also generated. COMPARISON:  None. FINDINGS: CT HEAD FINDINGS Brain: Age related involutional changes of the brain without acute  intracranial hemorrhage, midline shift or edema. No large vascular territory infarction. No hydrocephalus. No intra-axial mass nor extra-axial fluid. Vascular: No hyperdense vessel sign. Minimal atherosclerosis of the skull base. Skull: No acute skull fracture. Please see the maxillofacial CT report below for details on facial fractures. Other: Soft tissue swelling of the upper lip. CT MAXILLOFACIAL FINDINGS Osseous: The following acute fractures are identified: 1. Bilateral nasal bone fractures with minimal displacement of the tip of the right nasal bone laterally. 2. Fractures of the anterior, lateral and medial wall of the left maxillary sinus with minimal posterior displacement of the anterior wall fracture fragment by 3 mm. 3. Fracture at the junction of the left zygomatic arch and maxillary sinus, series 10/49. The remainder of the zygomatic arch appears intact. 4. Fractures of the lateral pterygoid plates bilaterally. 5. Fracture of the lateral wall as well as orbital floor of the left orbit without significant displacement. 6. Avulsed left upper central incisor. 7. Fracture of the nasal septum along its superior aspect, series 14/39. Orbits: Intact optic nerves, extraocular muscles and globes. No lens did attachments. Sinuses: Blood products opacifying much of the left maxillary sinus and moderately opacifying the ethmoid sinus. The right maxillary and sphenoid sinuses are clear as is the frontal sinus. Soft tissues: Soft tissue swelling of the upper lip. Mild left supraorbital soft tissue swelling. CT CERVICAL SPINE FINDINGS Alignment: Slight straightening of the cervical spine possibly positional or due to muscle spasm. Skull base and vertebrae: No acute fracture. No primary bone lesion or focal pathologic process. Soft tissues and spinal canal: No prevertebral fluid or swelling. No visible canal hematoma. Disc levels: Moderate disc flattening C4 through C7 with posterior marginal osteophytes and  uncovertebral joint osteoarthritis. Multilevel degenerative facet arthropathy. No significant central or foraminal encroachment. Upper chest: Negative. Other: None IMPRESSION: CT head: No acute intracranial abnormality. CT maxillofacial: Upper lip and supraorbital soft tissue swelling associated with fractures as below: 1. Bilateral nasal bone fractures with minimal displacement of the tip of the right nasal bone laterally. 2. Fractures of the anterior, lateral and medial wall of the left maxillary sinus with minimal posterior displacement of the anterior wall fracture fragment by 3 mm. 3. Fracture at the junction of the left zygomatic arch and maxillary sinus, series 10/49. The remainder of the zygomatic arch appears intact. 4. Fractures of the lateral pterygoid plates bilaterally. 5. Fracture of the lateral wall as well as orbital floor of the left orbit without significant displacement. 6. Avulsed left upper central incisor. 7. Fracture of the nasal septum along its superior aspect, series 14/39. Posttraumatic opacification of the left maxillary sinus with blood products as well as moderate opacification of the ethmoid sinus. CT cervical spine: No acute cervical spine fracture. Degenerative disc disease C4  through C7. Electronically Signed   By: Ashley Royalty M.D.   On: 08/09/2018 00:40   Ct Maxillofacial Wo Contrast  Result Date: 08/09/2018 CLINICAL DATA:  Maxillofacial trauma after syncopal episode. Avulsed front tooth and laceration above lip. EXAM: CT HEAD WITHOUT CONTRAST CT MAXILLOFACIAL WITHOUT CONTRAST CT CERVICAL SPINE WITHOUT CONTRAST TECHNIQUE: Multidetector CT imaging of the head, cervical spine, and maxillofacial structures were performed using the standard protocol without intravenous contrast. Multiplanar CT image reconstructions of the cervical spine and maxillofacial structures were also generated. COMPARISON:  None. FINDINGS: CT HEAD FINDINGS Brain: Age related involutional changes of the  brain without acute intracranial hemorrhage, midline shift or edema. No large vascular territory infarction. No hydrocephalus. No intra-axial mass nor extra-axial fluid. Vascular: No hyperdense vessel sign. Minimal atherosclerosis of the skull base. Skull: No acute skull fracture. Please see the maxillofacial CT report below for details on facial fractures. Other: Soft tissue swelling of the upper lip. CT MAXILLOFACIAL FINDINGS Osseous: The following acute fractures are identified: 1. Bilateral nasal bone fractures with minimal displacement of the tip of the right nasal bone laterally. 2. Fractures of the anterior, lateral and medial wall of the left maxillary sinus with minimal posterior displacement of the anterior wall fracture fragment by 3 mm. 3. Fracture at the junction of the left zygomatic arch and maxillary sinus, series 10/49. The remainder of the zygomatic arch appears intact. 4. Fractures of the lateral pterygoid plates bilaterally. 5. Fracture of the lateral wall as well as orbital floor of the left orbit without significant displacement. 6. Avulsed left upper central incisor. 7. Fracture of the nasal septum along its superior aspect, series 14/39. Orbits: Intact optic nerves, extraocular muscles and globes. No lens did attachments. Sinuses: Blood products opacifying much of the left maxillary sinus and moderately opacifying the ethmoid sinus. The right maxillary and sphenoid sinuses are clear as is the frontal sinus. Soft tissues: Soft tissue swelling of the upper lip. Mild left supraorbital soft tissue swelling. CT CERVICAL SPINE FINDINGS Alignment: Slight straightening of the cervical spine possibly positional or due to muscle spasm. Skull base and vertebrae: No acute fracture. No primary bone lesion or focal pathologic process. Soft tissues and spinal canal: No prevertebral fluid or swelling. No visible canal hematoma. Disc levels: Moderate disc flattening C4 through C7 with posterior marginal  osteophytes and uncovertebral joint osteoarthritis. Multilevel degenerative facet arthropathy. No significant central or foraminal encroachment. Upper chest: Negative. Other: None IMPRESSION: CT head: No acute intracranial abnormality. CT maxillofacial: Upper lip and supraorbital soft tissue swelling associated with fractures as below: 1. Bilateral nasal bone fractures with minimal displacement of the tip of the right nasal bone laterally. 2. Fractures of the anterior, lateral and medial wall of the left maxillary sinus with minimal posterior displacement of the anterior wall fracture fragment by 3 mm. 3. Fracture at the junction of the left zygomatic arch and maxillary sinus, series 10/49. The remainder of the zygomatic arch appears intact. 4. Fractures of the lateral pterygoid plates bilaterally. 5. Fracture of the lateral wall as well as orbital floor of the left orbit without significant displacement. 6. Avulsed left upper central incisor. 7. Fracture of the nasal septum along its superior aspect, series 14/39. Posttraumatic opacification of the left maxillary sinus with blood products as well as moderate opacification of the ethmoid sinus. CT cervical spine: No acute cervical spine fracture. Degenerative disc disease C4 through C7. Electronically Signed   By: Ashley Royalty M.D.   On: 08/09/2018 00:40  EKG: Independently reviewed.  Sinus rhythm, QTC 461, multiple PVC, nonspecific T wave change.   Assessment/Plan Principal Problem:   Syncope Active Problems:   Vertigo   HTN (hypertension)   Hyperlipidemia   AKI (acute kidney injury) ([place])   Fall   Fracture of maxilla (HCC)   Nasal bone fractures   Pterygoid plate fracture (HCC)   Orbital floor fracture (HCC)   Fracture of incisor teeth   Alcohol abuse   Syncope: Etiology is not clear, patient has history of vertigo and alcohol abuse which likely contributed at least partially.  Patient has history of PVC, cardiac arrhythmia is another  potential differential diagnosis.  Other differential diagnosis include orthostatic status, seizure or TIA.  - Place on tele bed for obs - Orthostatic vital signs  - MRI-brain - EEG - 2d echo - UDS - Neuro checks  - IVF: 1L NS, then 75 cc/h - PT/OT eval and treat  Vertigo: due to labyrinthitis.  Patient has been followed up with neurologist, Dr. Leeanne Rio -try as needed meclizine  HTN: Blood pressure 141/82 -hold Diovan due to AKI -start amlodipine 5 mg daily -IV hydralazine prn  Hyperlipidemia: -on niacin  AKI: Cre 1.30, BUN 19.  No baseline creatinine available.  Likely due to dehydration and continuation of ARB - IVF as above - Follow up renal function by BMP - Hold Diovan  Fall and multiple facial bone fracture: Including Fracture of maxilla, Nasal bone fractures, Pterygoid plate fracture, Orbital floor fracture, fracture of incisor teeth. -per percocet for pain -start Keflex 500 mg 3 times daily for prophylaxis -f/u CXR due to left lower chest wall pain -May need to call ENT in AM. -wound care consult for skin laceration.  Alcohol abuse: -Did counseling about the importance of quitting drinking -CIWA protocol   DVT ppx: SCD Code Status: Full code Family Communication: None at bed side.      Disposition Plan:  Anticipate discharge back to previous home environment Consults called:  none Admission status: Obs / tele    Date of Service 08/09/2018    Ivor Costa [facility] Pager [phone]  If 7PM-7AM, please contact night-coverage [email or web address] Password [facility] 08/09/2018, 6:32 AM

## 2018-08-09 NOTE — Consult Note (Signed)
Jordan Hicks, Jordan Hicks [id number] 1946-04-20  Reason for Consult:  Syncope with facial trauma  Requesting Physician:  Modena Jansky, MD   HPI:  72 yo wm fainted and fell yesterday.  CT shows sl displaced nasal fx's, non-displaced fx's of high nasal septum, pterygoid plates bilat, and LEFT trimalar zygomatic fx.  Blood in sinuses.  ROS:  Negative except as in HPI.  PMHx:   Past Medical History:  Diagnosis Date  . Arthritis    lower back  . Asthma    childhood  . HTN (hypertension)   . Hyperlipidemia   . Sleep apnea    as stated by wife    ALLERGIES:  No Known Allergies  MEDS:   No current facility-administered medications on file prior to encounter.    Current Outpatient Medications on File Prior to Encounter  Medication Sig Dispense Refill  . aspirin 81 MG tablet Take 81 mg by mouth daily.    . Multiple Vitamin (MULTIVITAMIN WITH MINERALS) TABS tablet Take 1 tablet by mouth daily.    . niacin 500 MG tablet Take 500 mg by mouth daily with breakfast.    . valsartan (DIOVAN) 160 MG tablet Take 160 mg by mouth daily.    . methylPREDNISolone (MEDROL DOSEPAK) 4 MG TBPK tablet follow package directions (Patient not taking: Reported on 08/09/2018) 21 tablet 1    PE;   BP (!) 145/84   Pulse (!) 109   Temp 97.9 F (36.6 C) (Oral)   Resp 18   Ht 6\' 3"  (1.905 m)   Wt 82.1 kg   SpO2 99%   BMI 22.62 kg/m    Facial swelling and excoriations.  Nose sl displaced to L.  Missing anterior upper teeth.      Dg Chest 2 View  Result Date: 08/09/2018 CLINICAL DATA:  Syncope, fall, left rib pain EXAM: CHEST - 2 VIEW COMPARISON:  None. FINDINGS: Lungs are clear.  No pleural effusion or pneumothorax. The heart is normal in size. Visualized osseous structures are within normal limits. IMPRESSION: Normal chest radiographs. Electronically Signed   By: Julian Hy M.D.   On: 08/09/2018 10:09   Dg Ribs Unilateral Left  Result Date: 08/09/2018 CLINICAL DATA:  Syncope, fall, left rib pain  EXAM: LEFT RIBS - 2 VIEW COMPARISON:  None. FINDINGS: No displaced left rib fracture is seen. Visualized left lung is clear. IMPRESSION: No displaced left rib fracture is seen. Electronically Signed   By: Julian Hy M.D.   On: 08/09/2018 10:09   Ct Head Wo Contrast  Result Date: 08/09/2018 CLINICAL DATA:  Maxillofacial trauma after syncopal episode. Avulsed front tooth and laceration above lip. EXAM: CT HEAD WITHOUT CONTRAST CT MAXILLOFACIAL WITHOUT CONTRAST CT CERVICAL SPINE WITHOUT CONTRAST TECHNIQUE: Multidetector CT imaging of the head, cervical spine, and maxillofacial structures were performed using the standard protocol without intravenous contrast. Multiplanar CT image reconstructions of the cervical spine and maxillofacial structures were also generated. COMPARISON:  None. FINDINGS: CT HEAD FINDINGS Brain: Age related involutional changes of the brain without acute intracranial hemorrhage, midline shift or edema. No large vascular territory infarction. No hydrocephalus. No intra-axial mass nor extra-axial fluid. Vascular: No hyperdense vessel sign. Minimal atherosclerosis of the skull base. Skull: No acute skull fracture. Please see the maxillofacial CT report below for details on facial fractures. Other: Soft tissue swelling of the upper lip. CT MAXILLOFACIAL FINDINGS Osseous: The following acute fractures are identified: 1. Bilateral nasal bone fractures with minimal displacement of the tip of the right nasal bone  laterally. 2. Fractures of the anterior, lateral and medial wall of the left maxillary sinus with minimal posterior displacement of the anterior wall fracture fragment by 3 mm. 3. Fracture at the junction of the left zygomatic arch and maxillary sinus, series 10/49. The remainder of the zygomatic arch appears intact. 4. Fractures of the lateral pterygoid plates bilaterally. 5. Fracture of the lateral wall as well as orbital floor of the left orbit without significant displacement. 6.  Avulsed left upper central incisor. 7. Fracture of the nasal septum along its superior aspect, series 14/39. Orbits: Intact optic nerves, extraocular muscles and globes. No lens did attachments. Sinuses: Blood products opacifying much of the left maxillary sinus and moderately opacifying the ethmoid sinus. The right maxillary and sphenoid sinuses are clear as is the frontal sinus. Soft tissues: Soft tissue swelling of the upper lip. Mild left supraorbital soft tissue swelling. CT CERVICAL SPINE FINDINGS Alignment: Slight straightening of the cervical spine possibly positional or due to muscle spasm. Skull base and vertebrae: No acute fracture. No primary bone lesion or focal pathologic process. Soft tissues and spinal canal: No prevertebral fluid or swelling. No visible canal hematoma. Disc levels: Moderate disc flattening C4 through C7 with posterior marginal osteophytes and uncovertebral joint osteoarthritis. Multilevel degenerative facet arthropathy. No significant central or foraminal encroachment. Upper chest: Negative. Other: None IMPRESSION: CT head: No acute intracranial abnormality. CT maxillofacial: Upper lip and supraorbital soft tissue swelling associated with fractures as below: 1. Bilateral nasal bone fractures with minimal displacement of the tip of the right nasal bone laterally. 2. Fractures of the anterior, lateral and medial wall of the left maxillary sinus with minimal posterior displacement of the anterior wall fracture fragment by 3 mm. 3. Fracture at the junction of the left zygomatic arch and maxillary sinus, series 10/49. The remainder of the zygomatic arch appears intact. 4. Fractures of the lateral pterygoid plates bilaterally. 5. Fracture of the lateral wall as well as orbital floor of the left orbit without significant displacement. 6. Avulsed left upper central incisor. 7. Fracture of the nasal septum along its superior aspect, series 14/39. Posttraumatic opacification of the left  maxillary sinus with blood products as well as moderate opacification of the ethmoid sinus. CT cervical spine: No acute cervical spine fracture. Degenerative disc disease C4 through C7. Electronically Signed   By: Ashley Royalty M.D.   On: 08/09/2018 00:40   Ct Cervical Spine Wo Contrast  Result Date: 08/09/2018 CLINICAL DATA:  Maxillofacial trauma after syncopal episode. Avulsed front tooth and laceration above lip. EXAM: CT HEAD WITHOUT CONTRAST CT MAXILLOFACIAL WITHOUT CONTRAST CT CERVICAL SPINE WITHOUT CONTRAST TECHNIQUE: Multidetector CT imaging of the head, cervical spine, and maxillofacial structures were performed using the standard protocol without intravenous contrast. Multiplanar CT image reconstructions of the cervical spine and maxillofacial structures were also generated. COMPARISON:  None. FINDINGS: CT HEAD FINDINGS Brain: Age related involutional changes of the brain without acute intracranial hemorrhage, midline shift or edema. No large vascular territory infarction. No hydrocephalus. No intra-axial mass nor extra-axial fluid. Vascular: No hyperdense vessel sign. Minimal atherosclerosis of the skull base. Skull: No acute skull fracture. Please see the maxillofacial CT report below for details on facial fractures. Other: Soft tissue swelling of the upper lip. CT MAXILLOFACIAL FINDINGS Osseous: The following acute fractures are identified: 1. Bilateral nasal bone fractures with minimal displacement of the tip of the right nasal bone laterally. 2. Fractures of the anterior, lateral and medial wall of the left maxillary sinus with minimal  posterior displacement of the anterior wall fracture fragment by 3 mm. 3. Fracture at the junction of the left zygomatic arch and maxillary sinus, series 10/49. The remainder of the zygomatic arch appears intact. 4. Fractures of the lateral pterygoid plates bilaterally. 5. Fracture of the lateral wall as well as orbital floor of the left orbit without significant  displacement. 6. Avulsed left upper central incisor. 7. Fracture of the nasal septum along its superior aspect, series 14/39. Orbits: Intact optic nerves, extraocular muscles and globes. No lens did attachments. Sinuses: Blood products opacifying much of the left maxillary sinus and moderately opacifying the ethmoid sinus. The right maxillary and sphenoid sinuses are clear as is the frontal sinus. Soft tissues: Soft tissue swelling of the upper lip. Mild left supraorbital soft tissue swelling. CT CERVICAL SPINE FINDINGS Alignment: Slight straightening of the cervical spine possibly positional or due to muscle spasm. Skull base and vertebrae: No acute fracture. No primary bone lesion or focal pathologic process. Soft tissues and spinal canal: No prevertebral fluid or swelling. No visible canal hematoma. Disc levels: Moderate disc flattening C4 through C7 with posterior marginal osteophytes and uncovertebral joint osteoarthritis. Multilevel degenerative facet arthropathy. No significant central or foraminal encroachment. Upper chest: Negative. Other: None IMPRESSION: CT head: No acute intracranial abnormality. CT maxillofacial: Upper lip and supraorbital soft tissue swelling associated with fractures as below: 1. Bilateral nasal bone fractures with minimal displacement of the tip of the right nasal bone laterally. 2. Fractures of the anterior, lateral and medial wall of the left maxillary sinus with minimal posterior displacement of the anterior wall fracture fragment by 3 mm. 3. Fracture at the junction of the left zygomatic arch and maxillary sinus, series 10/49. The remainder of the zygomatic arch appears intact. 4. Fractures of the lateral pterygoid plates bilaterally. 5. Fracture of the lateral wall as well as orbital floor of the left orbit without significant displacement. 6. Avulsed left upper central incisor. 7. Fracture of the nasal septum along its superior aspect, series 14/39. Posttraumatic opacification  of the left maxillary sinus with blood products as well as moderate opacification of the ethmoid sinus. CT cervical spine: No acute cervical spine fracture. Degenerative disc disease C4 through C7. Electronically Signed   By: Ashley Royalty M.D.   On: 08/09/2018 00:40   Mr Brain Wo Contrast  Result Date: 08/09/2018 CLINICAL DATA:  72 year old male with facial trauma after syncopal episode and fall. EXAM: MRI HEAD WITHOUT CONTRAST TECHNIQUE: Multiplanar, multiecho pulse sequences of the brain and surrounding structures were obtained without intravenous contrast. COMPARISON:  Head face and cervical spine CTs 08/08/2018. Brain MRI 01/15/2018. FINDINGS: Brain: No restricted diffusion to suggest acute infarction. No midline shift, mass effect, evidence of mass lesion, ventriculomegaly, extra-axial collection or acute intracranial hemorrhage. Cervicomedullary junction and pituitary are within normal limits. Mild to moderate for age scattered and occasionally patchy cerebral white matter T2 and FLAIR hyperintensity appears stable since February. No cortical encephalomalacia or chronic cerebral blood products identified. Deep gray matter nuclei, brainstem, and cerebellum appear normal for age. Vascular: Major intracranial vascular flow voids are stable and appear normal. Skull and upper cervical spine: Negative visible cervical spine. Normal bone marrow signal. Sinuses/Orbits: Intraorbital soft tissues appear stable and normal. There is a hemorrhage level in the left maxillary sinus. The remaining paranasal sinuses are well pneumatized. Other: Mastoid air cells remain clear. Visible internal auditory structures appear normal. IMPRESSION: 1.  No acute intracranial abnormality. Stable MRI appearance of the brain since February with mild  to moderate for age nonspecific cerebral white matter signal changes. 2. Layering hemorrhage in the left maxillary sinus related to facial fractures demonstrated yesterday. Electronically  Signed   By: Genevie Ann M.D.   On: 08/09/2018 10:43   Ct Maxillofacial Wo Contrast  Result Date: 08/09/2018 CLINICAL DATA:  Maxillofacial trauma after syncopal episode. Avulsed front tooth and laceration above lip. EXAM: CT HEAD WITHOUT CONTRAST CT MAXILLOFACIAL WITHOUT CONTRAST CT CERVICAL SPINE WITHOUT CONTRAST TECHNIQUE: Multidetector CT imaging of the head, cervical spine, and maxillofacial structures were performed using the standard protocol without intravenous contrast. Multiplanar CT image reconstructions of the cervical spine and maxillofacial structures were also generated. COMPARISON:  None. FINDINGS: CT HEAD FINDINGS Brain: Age related involutional changes of the brain without acute intracranial hemorrhage, midline shift or edema. No large vascular territory infarction. No hydrocephalus. No intra-axial mass nor extra-axial fluid. Vascular: No hyperdense vessel sign. Minimal atherosclerosis of the skull base. Skull: No acute skull fracture. Please see the maxillofacial CT report below for details on facial fractures. Other: Soft tissue swelling of the upper lip. CT MAXILLOFACIAL FINDINGS Osseous: The following acute fractures are identified: 1. Bilateral nasal bone fractures with minimal displacement of the tip of the right nasal bone laterally. 2. Fractures of the anterior, lateral and medial wall of the left maxillary sinus with minimal posterior displacement of the anterior wall fracture fragment by 3 mm. 3. Fracture at the junction of the left zygomatic arch and maxillary sinus, series 10/49. The remainder of the zygomatic arch appears intact. 4. Fractures of the lateral pterygoid plates bilaterally. 5. Fracture of the lateral wall as well as orbital floor of the left orbit without significant displacement. 6. Avulsed left upper central incisor. 7. Fracture of the nasal septum along its superior aspect, series 14/39. Orbits: Intact optic nerves, extraocular muscles and globes. No lens did  attachments. Sinuses: Blood products opacifying much of the left maxillary sinus and moderately opacifying the ethmoid sinus. The right maxillary and sphenoid sinuses are clear as is the frontal sinus. Soft tissues: Soft tissue swelling of the upper lip. Mild left supraorbital soft tissue swelling. CT CERVICAL SPINE FINDINGS Alignment: Slight straightening of the cervical spine possibly positional or due to muscle spasm. Skull base and vertebrae: No acute fracture. No primary bone lesion or focal pathologic process. Soft tissues and spinal canal: No prevertebral fluid or swelling. No visible canal hematoma. Disc levels: Moderate disc flattening C4 through C7 with posterior marginal osteophytes and uncovertebral joint osteoarthritis. Multilevel degenerative facet arthropathy. No significant central or foraminal encroachment. Upper chest: Negative. Other: None IMPRESSION: CT head: No acute intracranial abnormality. CT maxillofacial: Upper lip and supraorbital soft tissue swelling associated with fractures as below: 1. Bilateral nasal bone fractures with minimal displacement of the tip of the right nasal bone laterally. 2. Fractures of the anterior, lateral and medial wall of the left maxillary sinus with minimal posterior displacement of the anterior wall fracture fragment by 3 mm. 3. Fracture at the junction of the left zygomatic arch and maxillary sinus, series 10/49. The remainder of the zygomatic arch appears intact. 4. Fractures of the lateral pterygoid plates bilaterally. 5. Fracture of the lateral wall as well as orbital floor of the left orbit without significant displacement. 6. Avulsed left upper central incisor. 7. Fracture of the nasal septum along its superior aspect, series 14/39. Posttraumatic opacification of the left maxillary sinus with blood products as well as moderate opacification of the ethmoid sinus. CT cervical spine: No acute cervical spine fracture.  Degenerative disc disease C4 through C7.  Electronically Signed   By: Ashley Royalty M.D.   On: 08/09/2018 00:40     IMPRESSION:  Non displaced fx's of LEFT zygoma, pterygoid plates, nasal septum.  Displaced nasal fx.  PLAN:   Ice x 24 hrs.  Elevation x 3-4 days.  No nose blowing x 2 weeks.   Minimal antibiosis to prophylax sinus fx's.  Ophth appointment as an outpt.  Recheck my office 4-5 days.  May need closed reduction nasal fx with stabilization.        Jodi Marble 3/83/8184, 2:36 PM

## 2018-08-09 NOTE — Progress Notes (Signed)
  Echocardiogram 2D Echocardiogram has been performed.  Jordan Hicks 08/09/2018, 2:29 PM

## 2018-08-09 NOTE — Evaluation (Signed)
Physical Therapy Evaluation Patient Details Name: Jordan Hicks MRN: [id number] DOB: 1946-01-19 Today's Date: 08/09/2018   History of Present Illness  ick YASEEN GILBERG is a 72 y.o. 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    Clinical Impression  Patient evaluated by Physical Therapy with no further acute PT needs identified. All education has been completed and the patient has no further questions. PTA, pt independent living with wife in 2 story home. Today patient denies dizziness with standing or during vestibular testing, no nystagmus noted. DGI 23/24, ambulating unit at normal pace without AD or LOB. Did have significant BP drop but did not become dizzy this visit, explains that in past he has falled 6-8 times in as many months when standing and walking quickly discussed importance of waiting before doing so to prevent future falls.   See below for any follow-up Physical Therapy or equipment needs. PT is signing off. Thank you for this referral.  BP  Supine:141/86 Seated:142/92 Standing:116/82 (denies dizziness) 3 min standing:139/92      Follow Up Recommendations No PT follow up    Equipment Recommendations  None recommended by PT    Recommendations for Other Services       Precautions / Restrictions Precautions Precautions: Fall      Mobility  Bed Mobility Overal bed mobility: Independent                Transfers Overall transfer level: Modified independent                  Ambulation/Gait Ambulation/Gait assistance: Modified independent (Device/Increase time) Gait Distance (Feet): 200 Feet Assistive device: None Gait Pattern/deviations: WFL(Within Functional Limits) Gait velocity: normal   General Gait Details: DGI 23/24 no dizziness reported ambualting at baseline  Stairs            Wheelchair Mobility     Modified Rankin (Stroke Patients Only)       Balance Overall balance assessment: Independent                               Standardized Balance Assessment Standardized Balance Assessment : Dynamic Gait Index   Dynamic Gait Index Level Surface: Normal Change in Gait Speed: Normal Gait with Horizontal Head Turns: Normal Gait with Vertical Head Turns: Normal Gait and Pivot Turn: Normal Step Over Obstacle: Normal Step Around Obstacles: Mild Impairment       Pertinent Vitals/Pain Pain Assessment: 0-10 Pain Score: (3 at rest, 8 with motion ) Pain Location: decreased Pain Descriptors / Indicators: Sharp Pain Intervention(s): Limited activity within patient's tolerance;Monitored during session;Premedicated before session    Home Living Family/patient expects to be discharged to:: Private residence Living Arrangements: Spouse/significant other Available Help at Discharge: Family;Available PRN/intermittently(wife traveling ) Type of Home: House Home Access: Stairs to enter Entrance Stairs-Rails: Can reach both Entrance Stairs-Number of Steps: 3 Home Layout: Two level Home Equipment: None      Prior Function Level of Independence: Independent               Hand Dominance   Dominant Hand: Right    Extremity/Trunk Assessment   Upper Extremity Assessment Upper Extremity Assessment: Overall WFL for tasks assessed    Lower Extremity Assessment Lower Extremity Assessment: Overall WFL for tasks assessed    Cervical / Trunk Assessment Cervical / Trunk Assessment: Normal  Communication   Communication: No difficulties  Cognition Arousal/Alertness: Awake/alert Behavior During Therapy: WFL for tasks assessed/performed Overall Cognitive Status: Within Functional Limits for tasks assessed                                        General Comments General comments (skin integrity, edema, etc.): no nystagmus noted with vestibular  testing    Exercises     Assessment/Plan    PT Assessment Patent does not need any further PT services  PT Problem List         PT Treatment Interventions      PT Goals (Current goals can be found in the Care Plan section)  Acute Rehab PT Goals Patient Stated Goal: go home, less pain PT Goal Formulation: With patient Time For Goal Achievement: 08/16/18 Potential to Achieve Goals: Good    Frequency     Barriers to discharge        Co-evaluation               AM-PAC PT "6 Clicks" Daily Activity  Outcome Measure Difficulty turning over in bed (including adjusting bedclothes, sheets and blankets)?: None Difficulty moving from lying on back to sitting on the side of the bed? : None Difficulty sitting down on and standing up from a chair with arms (e.g., wheelchair, bedside commode, etc,.)?: None Help needed moving to and from a bed to chair (including a wheelchair)?: None Help needed walking in hospital room?: None Help needed climbing 3-5 steps with a railing? : None 6 Click Score: 24    End of Session Equipment Utilized During Treatment: Gait belt Activity Tolerance: Patient tolerated treatment well          Time: 1740-1800 PT Time Calculation (min) (ACUTE ONLY): 20 min   Charges:   PT Evaluation $PT Eval Low Complexity: 1 Low          Reinaldo Berber, PT, DPT Acute Rehabilitation Services Pager: [phone] Office: [place] 08/09/2018, 6:20 PM

## 2018-08-09 NOTE — Consult Note (Signed)
[facility] Nurse wound consult note Reason for Consult:partial thickness skin loss at left temporal area and left upper lip Wound type:Trauma Pressure Injury POA: NA Measurement: 4cm x 3cm area of abrasion at left temporal area, 1cm x 0.4cm laceration at left upper lip Wound bed: pink/yellow  Drainage (amount, consistency, odor) scant serous Periwound: edematous, mild ecchymosis Dressing procedure/placement/frequency:I will provide Nursing with Guidance via the Orders for twice daily applications of bacitracin ointment to provide a moist and antimicrobial environment to facilitate tissue repair and regeneration.  [facility] nursing team will not follow, but will remain available to this patient, the nursing and medical teams.  Please re-consult if needed. Thanks, Maudie Flakes, MSN, RN, [place], Arther Abbott  Pager# [phone]

## 2018-08-09 NOTE — ED Notes (Signed)
Patient transported to CT 

## 2018-08-09 NOTE — Progress Notes (Signed)
Patient denies immediate complaints during shift report.

## 2018-08-09 NOTE — Progress Notes (Addendum)
Patient assisted to standing position to urinate. Pt assisted to wheelchair, for transport to xray. Mid-radiology trip it was decided to get pt to MRI as well.

## 2018-08-10 DIAGNOSIS — R55 Syncope and collapse: Secondary | ICD-10-CM | POA: Diagnosis not present

## 2018-08-10 DIAGNOSIS — N179 Acute kidney failure, unspecified: Secondary | ICD-10-CM

## 2018-08-10 DIAGNOSIS — I951 Orthostatic hypotension: Secondary | ICD-10-CM

## 2018-08-10 DIAGNOSIS — F1010 Alcohol abuse, uncomplicated: Secondary | ICD-10-CM

## 2018-08-10 DIAGNOSIS — I10 Essential (primary) hypertension: Secondary | ICD-10-CM

## 2018-08-10 DIAGNOSIS — R69 Illness, unspecified: Secondary | ICD-10-CM | POA: Diagnosis not present

## 2018-08-10 LAB — BASIC METABOLIC PANEL
Anion gap: 10 (ref 5–15)
BUN: 6 mg/dL — ABNORMAL LOW (ref 8–23)
CALCIUM: 8.3 mg/dL — AB (ref 8.9–10.3)
CHLORIDE: 103 mmol/L (ref 98–111)
CO2: 23 mmol/L (ref 22–32)
Creatinine, Ser: 0.87 mg/dL (ref 0.61–1.24)
GFR calc Af Amer: >60 mL/min (ref >60)
Glucose, Bld: 113 mg/dL — ABNORMAL HIGH (ref 70–99)
Potassium: 3.9 mmol/L (ref 3.5–5.1)
Sodium: 136 mmol/L (ref 135–145)

## 2018-08-10 LAB — CBC
HEMATOCRIT: 38.5 % — AB (ref 39.0–52.0)
HEMOGLOBIN: 13 g/dL (ref 13.0–17.0)
MCH: 33.6 pg (ref 26.0–34.0)
MCHC: 33.8 g/dL (ref 30.0–36.0)
MCV: 99.5 fL (ref 78.0–100.0)
Platelets: 207 10*3/uL (ref 150–400)
RBC: 3.87 MIL/uL — ABNORMAL LOW (ref 4.22–5.81)
RDW: 12 % (ref 11.5–15.5)
WBC: 7 10*3/uL (ref 4.0–10.5)

## 2018-08-10 LAB — GLUCOSE, CAPILLARY: GLUCOSE-CAPILLARY: 94 mg/dL (ref 70–99)

## 2018-08-10 LAB — MAGNESIUM: MAGNESIUM: 1.9 mg/dL (ref 1.7–2.4)

## 2018-08-10 MED ORDER — THIAMINE HCL 100 MG PO TABS: 100.0000 mg | ORAL_TABLET | Freq: Every day | ORAL | 0 refills | Status: AC

## 2018-08-10 MED ORDER — INFLUENZA VAC SPLIT HIGH-DOSE 0.5 ML IM SUSY
0.5000 mL | PREFILLED_SYRINGE | INTRAMUSCULAR | Status: DC
  Filled 2018-08-10: qty 0.5

## 2018-08-10 MED ORDER — CEPHALEXIN 500 MG PO CAPS: 500.0000 mg | ORAL_CAPSULE | Freq: Three times a day (TID) | ORAL | 0 refills | Status: AC

## 2018-08-10 MED ORDER — FOLIC ACID 1 MG PO TABS: 1.0000 mg | ORAL_TABLET | Freq: Every day | ORAL | 0 refills | Status: AC

## 2018-08-10 MED ORDER — BACITRACIN ZINC 500 UNIT/GM EX OINT: TOPICAL_OINTMENT | Freq: Two times a day (BID) | CUTANEOUS | 0 refills | Status: AC

## 2018-08-10 NOTE — Care Management Obs Status (Signed)
[facility] NOTIFICATION   Patient Details  Name: Jordan Hicks MRN: [id number] Date of Birth: 07-23-1946   Medicare Observation Status Notification Given:  Yes    Carles Collet, RN 08/10/2018, 12:33 PM

## 2018-08-10 NOTE — Plan of Care (Signed)
  Problem: Coping: Goal: Level of anxiety will decrease Outcome: Progressing   Problem: Pain Managment: Goal: General experience of comfort will improve Outcome: Progressing   Problem: Safety: Goal: Ability to remain free from injury will improve Outcome: Progressing   

## 2018-08-10 NOTE — Discharge Instructions (Addendum)
Please get your medications reviewed and adjusted by your Primary MD. ° °Please request your Primary MD to go over all Hospital Tests and Procedure/Radiological results at the follow up, please get all Hospital records sent to your Prim MD by signing hospital release before you go home. ° °If you had Pneumonia of Lung problems at the Hospital: °Please get a 2 view Chest X ray done in 6-8 weeks after hospital discharge or sooner if instructed by your Primary MD. ° °If you have Congestive Heart Failure: °Please call your Cardiologist or Primary MD anytime you have any of the following symptoms:  °1) 3 pound weight gain in 24 hours or 5 pounds in 1 week  °2) shortness of breath, with or without a dry hacking cough  °3) swelling in the hands, feet or stomach  °4) if you have to sleep on extra pillows at night in order to breathe ° °Follow cardiac low salt diet and 1.5 lit/day fluid restriction. ° °If you have diabetes °Accuchecks 4 times/day, Once in AM empty stomach and then before each meal. °Log in all results and show them to your primary doctor at your next visit. °If any glucose reading is under 80 or above 300 call your primary MD immediately. ° °If you have Seizure/Convulsions/Epilepsy: °Please do not drive, operate heavy machinery, participate in activities at heights or participate in high speed sports until you have seen by Primary MD or a Neurologist and advised to do so again. ° °If you had Gastrointestinal Bleeding: °Please ask your Primary MD to check a complete blood count within one week of discharge or at your next visit. Your endoscopic/colonoscopic biopsies that are pending at the time of discharge, will also need to followed by your Primary MD. ° °Get Medicines reviewed and adjusted. °Please take all your medications with you for your next visit with your Primary MD ° °Please request your Primary MD to go over all hospital tests and procedure/radiological results at the follow up, please ask your  Primary MD to get all Hospital records sent to his/her office. ° °If you experience worsening of your admission symptoms, develop shortness of breath, life threatening emergency, suicidal or homicidal thoughts you must seek medical attention immediately by calling 911 or calling your MD immediately  if symptoms less severe. ° °You must read complete instructions/literature along with all the possible adverse reactions/side effects for all the Medicines you take and that have been prescribed to you. Take any new Medicines after you have completely understood and accpet all the possible adverse reactions/side effects.  ° °Do not drive or operate heavy machinery when taking Pain medications.  ° °Do not take more than prescribed Pain, Sleep and Anxiety Medications ° °Special Instructions: If you have smoked or chewed Tobacco  in the last 2 yrs please stop smoking, stop any regular Alcohol  and or any Recreational drug use. ° °Wear Seat belts while driving. ° °Please note °You were cared for by a hospitalist during your hospital stay. If you have any questions about your discharge medications or the care you received while you were in the hospital after you are discharged, you can call the unit and asked to speak with the hospitalist on call if the hospitalist that took care of you is not available. Once you are discharged, your primary care physician will handle any further medical issues. Please note that NO REFILLS for any discharge medications will be authorized once you are discharged, as it is imperative that you   return to your primary care physician (or establish a relationship with a primary care physician if you do not have one) for your aftercare needs so that they can reassess your need for medications and monitor your lab values.  You can reach the hospitalist office at phone [phone] or fax [phone]   If you do not have a primary care physician, you can call [phone] for a physician  referral.     Syncope Syncope is when you temporarily lose consciousness. Syncope may also be called fainting or passing out. It is caused by a sudden decrease in blood flow to the brain. Even though most causes of syncope are not dangerous, syncope can be a sign of a serious medical problem. Signs that you may be about to faint include:  Feeling dizzy or light-headed.  Feeling nauseous.  Seeing all white or all black in your field of vision.  Having cold, clammy skin.  If you fainted, get medical help right away.Call your local emergency services (911 in the [place] Do not drive yourself to the hospital. Follow these instructions at home: Pay attention to any changes in your symptoms. Take these actions to help with your condition:  Have someone stay with you until you feel stable.  Do not drive, use machinery, or play sports until your health care provider says it is okay.  Keep all follow-up visits as told by your health care provider. This is important.  If you start to feel like you might faint, lie down right away and raise (elevate) your feet above the level of your heart. Breathe deeply and steadily. Wait until all of the symptoms have passed.  Drink enough fluid to keep your urine clear or pale yellow.  If you are taking blood pressure or heart medicine, get up slowly and take several minutes to sit and then stand. This can reduce dizziness.  Take over-the-counter and prescription medicines only as told by your health care provider.  Get help right away if:  You have a severe headache.  You have unusual pain in your chest, abdomen, or back.  You are bleeding from your mouth or rectum, or you have black or tarry stool.  You have a very fast or irregular heartbeat (palpitations).  You have pain with breathing.  You faint once or repeatedly.  You have a seizure.  You are confused.  You have trouble walking.  You have severe weakness.  You have vision  problems. These symptoms may represent a serious problem that is an emergency. Do not wait to see if your symptoms will go away. Get medical help right away. Call your local emergency services (911 in the [place] Do not drive yourself to the hospital. This information is not intended to replace advice given to you by your health care provider. Make sure you discuss any questions you have with your health care provider. Document Released: 11/05/2005 Document Revised: 04/12/2016 Document Reviewed: 07/20/2015 Elsevier Interactive Patient Education  2018 [employer].    Orthostatic Hypotension Orthostatic hypotension is a sudden drop in blood pressure that happens when you quickly change positions, such as when you get up from a seated or lying position. Blood pressure is a measurement of how strongly, or weakly, your blood is pressing against the walls of your arteries. Arteries are blood vessels that carry blood from your heart throughout your body. When blood pressure is too low, you may not get enough blood to your brain or to the rest of your organs. This  can cause weakness, light-headedness, rapid heartbeat, and fainting. This can last for just a few seconds or for up to a few minutes. Orthostatic hypotension is usually not a serious problem. However, if it happens frequently or gets worse, it may be a sign of something more serious. What are the causes? This condition may be caused by:  Sudden changes in posture, such as standing up quickly after you have been sitting or lying down.  Blood loss.  Loss of body fluids (dehydration).  Heart problems.  Hormone (endocrine) problems.  Pregnancy.  Severe infection.  Lack of certain nutrients.  Severe allergic reactions (anaphylaxis).  Certain medicines, such as blood pressure medicine or medicines that make the body lose excess fluids (diuretics). Sometimes, this condition can be caused by not taking medicine as directed, such as taking  too much of a certain medicine.  What increases the risk? Certain factors can make you more likely to develop orthostatic hypotension, including:  Age. Risk increases as you get older.  Conditions that affect the heart or the central nervous system.  Taking certain medicines, such as blood pressure medicine or diuretics.  Being pregnant.  What are the signs or symptoms? Symptoms of this condition may include:  Weakness.  Light-headedness.  Dizziness.  Blurred vision.  Fatigue.  Rapid heartbeat.  Fainting, in severe cases.  How is this diagnosed? This condition is diagnosed based on:  Your medical history.  Your symptoms.  Your blood pressure measurement. Your health care provider will check your blood pressure when you are: ? Lying down. ? Sitting. ? Standing.  A blood pressure reading is recorded as two numbers, such as "120 over 80" (or 120/80). The first ("top") number is called the systolic pressure. It is a measure of the pressure in your arteries as your heart beats. The second ("bottom") number is called the diastolic pressure. It is a measure of the pressure in your arteries when your heart relaxes between beats. Blood pressure is measured in a unit called mm Hg. Healthy blood pressure for adults is 120/80. If your blood pressure is below 90/60, you may be diagnosed with hypotension. Other information or tests that may be used to diagnose orthostatic hypotension include:  Your other vital signs, such as your heart rate and temperature.  Blood tests.  Tilt table test. For this test, you will be safely secured to a table that moves you from a lying position to an upright position. Your heart rhythm and blood pressure will be monitored during the test.  How is this treated? Treatment for this condition may include:  Changing your diet. This may involve eating more salt (sodium) or drinking more water.  Taking medicines to raise your blood  pressure.  Changing the dosage of certain medicines you are taking that might be lowering your blood pressure.  Wearing compression stockings. These stockings help to prevent blood clots and reduce swelling in your legs.  In some cases, you may need to go to the hospital for:  Fluid replacement. This means you will receive fluids through an IV tube.  Blood replacement. This means you will receive donated blood through an IV tube (transfusion).  Treating an infection or heart problems, if this applies.  Monitoring. You may need to be monitored while medicines that you are taking wear off.  Follow these instructions at home: Eating and drinking   Drink enough fluid to keep your urine clear or pale yellow.  Eat a healthy diet and follow instructions from your  health care provider about eating or drinking restrictions. A healthy diet includes: ? Fresh fruits and vegetables. ? Whole grains. ? Lean meats. ? Low-fat dairy products.  Eat extra salt only as directed. Do not add extra salt to your diet unless your health care provider told you to do that.  Eat frequent, small meals.  Avoid standing up suddenly after eating. Medicines  Take over-the-counter and prescription medicines only as told by your health care provider. ? Follow instructions from your health care provider about changing the dosage of your current medicines, if this applies. ? Do not stop or adjust any of your medicines on your own. General instructions  Wear compression stockings as told by your health care provider.  Get up slowly from lying down or sitting positions. This gives your blood pressure a chance to adjust.  Avoid hot showers and excessive heat as directed by your health care provider.  Return to your normal activities as told by your health care provider. Ask your health care provider what activities are safe for you.  Do not use any products that contain nicotine or tobacco, such as cigarettes  and e-cigarettes. If you need help quitting, ask your health care provider.  Keep all follow-up visits as told by your health care provider. This is important. Contact a health care provider if:  You vomit.  You have diarrhea.  You have a fever for more than 2-3 days.  You feel more thirsty than usual.  You feel weak and tired. Get help right away if:  You have chest pain.  You have a fast or irregular heartbeat.  You develop numbness in any part of your body.  You cannot move your arms or your legs.  You have trouble speaking.  You become sweaty or feel lightheaded.  You faint.  You feel short of breath.  You have trouble staying awake.  You feel confused. This information is not intended to replace advice given to you by your health care provider. Make sure you discuss any questions you have with your health care provider. Document Released: 10/26/2002 Document Revised: 07/24/2016 Document Reviewed: 04/27/2016 Elsevier Interactive Patient Education  2018 [employer].

## 2018-08-10 NOTE — Progress Notes (Signed)
Awaiting follow up from social work regarding resources for patient related to alcohol use/quitting drinking.

## 2018-08-10 NOTE — Progress Notes (Signed)
Page to Dr Lorraine Lax, per new protocol to contact on-call neuro for any EEG orders (neuro to then call EEG tech).  Dr Aroor states that the list for EEGs today is long; they may not make it to do the patient's EEG today.  Page to Dr Algis Liming, of now known delay in EEG completion.

## 2018-08-10 NOTE — Progress Notes (Signed)
Pt provided SUD resources in [place] area. Pt acknowledged list and will search for possible interventions. CSW signing off.

## 2018-08-10 NOTE — Evaluation (Signed)
Occupational Therapy Evaluation Patient Details Name: Jordan Hicks MRN: [id number] DOB: [birth year] Today's Date: 08/10/2018    History of Present Illness 72 y.o. 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   Clinical Impression   This 72 y/o male presents with the above. At baseline pt is independent with ADLs, iADLs and functional mobility. Reports he remains active. Pt demonstrating room level functional mobility, LB and standing grooming ADLs without AD and overall supervision, no overt LOB noted. Pt denies lightheadedness/dizziness throughout task completion. Pt noted to have slight tremors in bil UEs but does not appear to impact pt's ability to perform functional tasks. Discussion held and education provided regarding safety at home during mobility and ADL task completion to reduce risk of falls, pt verbalizing understanding and questions answered throughout. No further acute OT needs identified at this time, do not anticipate pt will require follow up OT services at time of discharge. Acute OT to sign off at this time, thank you for this referral.     Follow Up Recommendations  No OT follow up;Supervision - Intermittent    Equipment Recommendations  None recommended by OT           Precautions / Restrictions Precautions Precautions: Fall Restrictions Weight Bearing Restrictions: No      Mobility Bed Mobility Overal bed mobility: Independent                Transfers Overall transfer level: Modified independent                    Balance Overall balance assessment: No apparent balance deficits (not formally assessed)                                         ADL either performed or assessed with clinical judgement   ADL Overall ADL's : Needs assistance/impaired Eating/Feeding: Independent;Sitting   Grooming:  Wash/dry face;Oral care;Supervision/safety;Standing   Upper Body Bathing: Supervision/ safety;Sitting   Lower Body Bathing: Supervison/ safety;Sit to/from stand Lower Body Bathing Details (indicate cue type and reason): educated for pt to perform LB bathing from sit<>stand level (vs only standing) after return home to increase overall safety with task completion, pt verbalizing understanding  Upper Body Dressing : Modified independent;Sitting   Lower Body Dressing: Supervision/safety;Sit to/from stand Lower Body Dressing Details (indicate cue type and reason): pt doffing/donning sock without difficulty Toilet Transfer: Supervision/safety;Ambulation;Regular Toilet   Toileting- Water quality scientist and Hygiene: Supervision/safety;Sit to/from stand       Functional mobility during ADLs: Supervision/safety General ADL Comments: educated pt on general safety during ADL and mobility tasks to decrease risk of falls at home; pt verbalizing understanding; pt completing ADLs and room level mobility this session and denies s/s of lightheadedness/dizziness     Vision Baseline Vision/History: Wears glasses Wears Glasses: At all times Patient Visual Report: No change from baseline Vision Assessment?: Yes Eye Alignment: Within Functional Limits Ocular Range of Motion: Within Functional Limits Alignment/Gaze Preference: Within Defined Limits Tracking/Visual Pursuits: Able to track stimulus in all quads without difficulty Saccades: Additional eye shifts occurred during testing Additional Comments: slight beating nystagmus noted with visual testing towards L visual field      Perception     Praxis      Pertinent Vitals/Pain Pain Assessment: 0-10 Pain Score: 3 (3  at rest, up to 9 with mobility) Pain Location: L ribcage Pain Descriptors / Indicators: Sharp Pain Intervention(s): Monitored during session;Repositioned     Hand Dominance Right   Extremity/Trunk Assessment Upper Extremity  Assessment Upper Extremity Assessment: RUE deficits/detail;LUE deficits/detail RUE Deficits / Details: mild tremors noted in bil UEs (R>L); pt reports does not interfere with functional tasks; also reports rotator cuff tear in R shoulder, able to move through full ROM without difficulty  LUE Deficits / Details: mild tremors noted in bil UEs (R>L); pt reports does not interfere with functional tasks   Lower Extremity Assessment Lower Extremity Assessment: Defer to PT evaluation   Cervical / Trunk Assessment Cervical / Trunk Assessment: Normal   Communication Communication Communication: No difficulties   Cognition Arousal/Alertness: Awake/alert Behavior During Therapy: WFL for tasks assessed/performed Overall Cognitive Status: Within Functional Limits for tasks assessed                                     General Comments       Exercises     Shoulder Instructions      Home Living Family/patient expects to be discharged to:: Private residence Living Arrangements: Spouse/significant other Available Help at Discharge: Family;Available PRN/intermittently(wife traveling ) Type of Home: House Home Access: Stairs to enter [employer] of Steps: 3 Entrance Stairs-Rails: Can reach both Home Layout: Two level     Bathroom Shower/Tub: Occupational psychologist: Standard     Home Equipment: Civil engineer, contracting - built in          Prior Functioning/Environment Level of Independence: Independent                 OT Problem List: Impaired UE functional use;Decreased activity tolerance      OT Treatment/Interventions:      OT Goals(Current goals can be found in the care plan section) Acute Rehab OT Goals Patient Stated Goal: go home, less pain OT Goal Formulation: All assessment and education complete, DC therapy  OT Frequency:     Barriers to D/C:            Co-evaluation              AM-PAC PT "6 Clicks" Daily Activity      Outcome Measure Help from another person eating meals?: None Help from another person taking care of personal grooming?: None Help from another person toileting, which includes using toliet, bedpan, or urinal?: None Help from another person bathing (including washing, rinsing, drying)?: None Help from another person to put on and taking off regular upper body clothing?: None Help from another person to put on and taking off regular lower body clothing?: None 6 Click Score: 24   End of Session Equipment Utilized During Treatment: Gait belt Nurse Communication: Mobility status  Activity Tolerance: Patient tolerated treatment well Patient left: in bed;with call bell/phone within reach  OT Visit Diagnosis: Dizziness and giddiness (R42)                Time: 1443-1540 OT Time Calculation (min): 19 min Charges:  OT General Charges $OT Visit: 1 Visit OT Evaluation $OT Eval Low Complexity: 1 Low  Lou Cal, OT [facility] Services Pager [phone] Office [phone]  Raymondo Band 08/10/2018, 11:34 AM

## 2018-08-10 NOTE — Progress Notes (Signed)
Patient resting comfortably during shift report. Denies complaints.  

## 2018-08-10 NOTE — Discharge Summary (Addendum)
Physician Discharge Summary  Jordan Hicks PQZ:[id number] DOB: 03-03-1946  PCP: Burnard Bunting, MD  Admit date: 08/08/2018 Discharge date: 08/10/2018  Recommendations for Outpatient Follow-up:  1. Dr. Burnard Bunting, PCP: Patient has prior appointment for 08/11/2018 at 8:15 AM which he is advised to keep. 2. Dr. Jodi Marble, ENT.  Patient advised to call 08/11/2018 for a follow-up appointment in the next 4-5 days. 3. Ophthalmology/?  Dr. Merryl Hacker: Patient reports that he has an Ophthalmologist and he was advised to call 08/11/2018 for an appointment to be seen within a week. 4. Landry Dyke, Neurology  Home Health: None Equipment/Devices: None  Discharge Condition: Improved and stable CODE STATUS: Full Diet recommendation: Heart healthy diet.  Discharge Diagnoses:  Principal Problem:   Syncope Active Problems:   Vertigo   HTN (hypertension)   Hyperlipidemia   AKI (acute kidney injury) ([place])   Fall   Fracture of maxilla (HCC)   Nasal bone fractures   Pterygoid plate fracture (HCC)   Orbital floor fracture (HCC)   Fracture of incisor teeth   Alcohol abuse   Brief Summary: 72 year old male with PMH of HTN, HLD, asthma, OSA not on CPAP, vertigo, PVCs, alcohol dependence, presented to [facility] ED on 08/08/2018 after syncope and fall at [facility].  He had been drinking alcohol.  Blood alcohol level 193.  CT head and neck were negative for acute findings but maxillofacial CT showed multiple fractures.  Admitted for syncope, alcohol intoxication and concern for withdrawal, facial fractures.  ENT consulted.   Assessment & Plan:  Syncope: Possibly due to alcohol intoxication and orthostatic hypotension.  Orthostatic blood pressure checks were positive but patient is asymptomatic of dizziness or lightheadedness during the checks or with ambulation.  He is clinically euvolemic.  He was observed on telemetry and apart from occasional PVCs, no arrhythmias were  noted.  Other DD: Vasovagal. Seizure seems less likely. Stroke ruled out by MRI. MRI brain: No acute abnormalities. TTE 9/21: LVEF 55-60% and grade 1 diastolic dysfunction.  PT evaluated and do not recommend any follow-up.  Improved and stable.  Ambulating without symptoms.  Patient has been counseled extensively regarding no driving for 6 months or until cleared by his outpatient physicians during office visit.  He verbalized understanding.  If patient has recurrent syncopal episodes, may need further evaluation.  Orthostatic hypotension: Unclear if this is a chronic issue.  Clinically euvolemic.  Does not seem to be on any medications that would cause this.  Patient has been counseled regarding measures to take to minimize or prevent symptoms and he verbalizes understanding.  He is currently asymptomatic of dizziness, lightheadedness or feeling like passing out while he is ambulating.  Close outpatient follow-up.  Vertigo: Reportedly due to labyrinthitis.  Apparently had improved and then recurred.  Has seen with Dr. Jaynee Eagles, Neurology in February 2019.    Unclear if symptoms that he describes is related to his orthostatic hypotension or true vertigo.  Outpatient follow-up with neurology.  Currently asymptomatic.  Essential hypertension:  Diovan had been temporarily held and treated instead with amlodipine due to transient acute kidney injury.  Acute kidney injury has resolved and Diovan will be resumed at discharge.  Hyperlipidemia: Continue niacin.  Acute kidney injury: Secondary to dehydration and ARB. Diovan temporarily held.  Treated with IV fluids.  Resolved.  Follow BMP periodically as outpatient.  Fall with multiple facial fractures: ENT consultation appreciated.  Nondisplaced fractures of left zygoma, pterygoid plates, nasal septum, displaced nasal fracture.  Conservative management  as outlined by ENT, ophthalmology appointment as outpatient, follow-up with ENT in 4 to 5 days and may need  closed reduction of nasal fracture with stabilization.  Alcohol dependence: Reportedly drinks 1.5 bottles of wine every day.  Moderation and eventual cessation counseled.  Clinical social work was consulted to provide patient with resources to assist.  No overt withdrawal and CIWA in the 1-3.  Continue multivitamins, thiamine and folate.  Patient states that he has definitely decided to quit alcohol.   Consultants:  ENT  Procedures:  TTE 08/09/2018:  Study Conclusions  - Left ventricle: The cavity size was normal. Systolic function was   normal. The estimated ejection fraction was in the range of 55%   to 60%. Regional wall motion abnormalities cannot be excluded.   Doppler parameters are consistent with abnormal left ventricular   relaxation (grade 1 diastolic dysfunction).  Discharge Instructions  Discharge Instructions    Call MD for:   Complete by:  As directed    Passing out or feeling like passing out.   Call MD for:  difficulty breathing, headache or visual disturbances   Complete by:  As directed    Call MD for:  extreme fatigue   Complete by:  As directed    Call MD for:  persistant dizziness or light-headedness   Complete by:  As directed    Call MD for:  persistant nausea and vomiting   Complete by:  As directed    Call MD for:  redness, tenderness, or signs of infection (pain, swelling, redness, odor or green/yellow discharge around incision site)   Complete by:  As directed    Call MD for:  severe uncontrolled pain   Complete by:  As directed    Call MD for:  temperature >100.4   Complete by:  As directed    Diet - low sodium heart healthy   Complete by:  As directed    Driving Restrictions   Complete by:  As directed    No driving for 6 months or until cleared by your physician to do so during outpatient office visit.   Increase activity slowly   Complete by:  As directed        Medication List    STOP taking these medications   methylPREDNISolone  4 MG Tbpk tablet Commonly known as:  MEDROL DOSEPAK     TAKE these medications   aspirin 81 MG tablet Take 81 mg by mouth daily.   bacitracin ointment Apply topically 2 (two) times daily for 7 days. Apply to abrasion at left temporal area and laceration at left upper lip   cephALEXin 500 MG capsule Commonly known as:  KEFLEX Take 1 capsule (500 mg total) by mouth 3 (three) times daily for 6 days.   folic acid 1 MG tablet Commonly known as:  FOLVITE Take 1 tablet (1 mg total) by mouth daily. Start taking on:  08/11/2018   multivitamin with minerals Tabs tablet Take 1 tablet by mouth daily.   niacin 500 MG tablet Take 500 mg by mouth daily with breakfast.   thiamine 100 MG tablet Take 1 tablet (100 mg total) by mouth daily. Start taking on:  08/11/2018   valsartan 160 MG tablet Commonly known as:  DIOVAN Take 160 mg by mouth daily.      Follow-up Information    Burnard Bunting, MD Follow up on 08/11/2018.   Specialty:  Internal Medicine Why:  8:15 am. Contact information: [address] [place] [address] [phone]  Jodi Marble, MD. Call.   Specialty:  Otolaryngology Why:  Call 08/11/2018 for a follow-up appointment this coming week. Contact information: [address] [place] [address] [phone]        Opthalmology Dr [facility] doctor). Schedule an appointment as soon as possible for a visit.   Why:  Call 08/11/2018 for an appointment to be seen within a week.       Melvenia Beam, MD. Schedule an appointment as soon as possible for a visit.   Specialty:  Neurology Why:  Follow-up regarding dizziness or vertigo. Contact information: [address]  [place] [address] [phone]          Allergies  Allergen Reactions  . Lisinopril Cough      Procedures/Studies: Dg Chest 2 View  Result Date: 08/09/2018 CLINICAL DATA:  Syncope, fall, left rib pain EXAM: CHEST - 2 VIEW COMPARISON:  None. FINDINGS: Lungs  are clear.  No pleural effusion or pneumothorax. The heart is normal in size. Visualized osseous structures are within normal limits. IMPRESSION: Normal chest radiographs. Electronically Signed   By: Julian Hy M.D.   On: 08/09/2018 10:09   Dg Ribs Unilateral Left  Result Date: 08/09/2018 CLINICAL DATA:  Syncope, fall, left rib pain EXAM: LEFT RIBS - 2 VIEW COMPARISON:  None. FINDINGS: No displaced left rib fracture is seen. Visualized left lung is clear. IMPRESSION: No displaced left rib fracture is seen. Electronically Signed   By: Julian Hy M.D.   On: 08/09/2018 10:09   Ct Head Wo Contrast  Result Date: 08/09/2018 CLINICAL DATA:  Maxillofacial trauma after syncopal episode. Avulsed front tooth and laceration above lip. EXAM: CT HEAD WITHOUT CONTRAST CT MAXILLOFACIAL WITHOUT CONTRAST CT CERVICAL SPINE WITHOUT CONTRAST TECHNIQUE: Multidetector CT imaging of the head, cervical spine, and maxillofacial structures were performed using the standard protocol without intravenous contrast. Multiplanar CT image reconstructions of the cervical spine and maxillofacial structures were also generated. COMPARISON:  None. FINDINGS: CT HEAD FINDINGS Brain: Age related involutional changes of the brain without acute intracranial hemorrhage, midline shift or edema. No large vascular territory infarction. No hydrocephalus. No intra-axial mass nor extra-axial fluid. Vascular: No hyperdense vessel sign. Minimal atherosclerosis of the skull base. Skull: No acute skull fracture. Please see the maxillofacial CT report below for details on facial fractures. Other: Soft tissue swelling of the upper lip. CT MAXILLOFACIAL FINDINGS Osseous: The following acute fractures are identified: 1. Bilateral nasal bone fractures with minimal displacement of the tip of the right nasal bone laterally. 2. Fractures of the anterior, lateral and medial wall of the left maxillary sinus with minimal posterior displacement of the  anterior wall fracture fragment by 3 mm. 3. Fracture at the junction of the left zygomatic arch and maxillary sinus, series 10/49. The remainder of the zygomatic arch appears intact. 4. Fractures of the lateral pterygoid plates bilaterally. 5. Fracture of the lateral wall as well as orbital floor of the left orbit without significant displacement. 6. Avulsed left upper central incisor. 7. Fracture of the nasal septum along its superior aspect, series 14/39. Orbits: Intact optic nerves, extraocular muscles and globes. No lens did attachments. Sinuses: Blood products opacifying much of the left maxillary sinus and moderately opacifying the ethmoid sinus. The right maxillary and sphenoid sinuses are clear as is the frontal sinus. Soft tissues: Soft tissue swelling of the upper lip. Mild left supraorbital soft tissue swelling. CT CERVICAL SPINE FINDINGS Alignment: Slight straightening of the cervical spine possibly positional or due  to muscle spasm. Skull base and vertebrae: No acute fracture. No primary bone lesion or focal pathologic process. Soft tissues and spinal canal: No prevertebral fluid or swelling. No visible canal hematoma. Disc levels: Moderate disc flattening C4 through C7 with posterior marginal osteophytes and uncovertebral joint osteoarthritis. Multilevel degenerative facet arthropathy. No significant central or foraminal encroachment. Upper chest: Negative. Other: None IMPRESSION: CT head: No acute intracranial abnormality. CT maxillofacial: Upper lip and supraorbital soft tissue swelling associated with fractures as below: 1. Bilateral nasal bone fractures with minimal displacement of the tip of the right nasal bone laterally. 2. Fractures of the anterior, lateral and medial wall of the left maxillary sinus with minimal posterior displacement of the anterior wall fracture fragment by 3 mm. 3. Fracture at the junction of the left zygomatic arch and maxillary sinus, series 10/49. The remainder of the  zygomatic arch appears intact. 4. Fractures of the lateral pterygoid plates bilaterally. 5. Fracture of the lateral wall as well as orbital floor of the left orbit without significant displacement. 6. Avulsed left upper central incisor. 7. Fracture of the nasal septum along its superior aspect, series 14/39. Posttraumatic opacification of the left maxillary sinus with blood products as well as moderate opacification of the ethmoid sinus. CT cervical spine: No acute cervical spine fracture. Degenerative disc disease C4 through C7. Electronically Signed   By: Ashley Royalty M.D.   On: 08/09/2018 00:40   Ct Cervical Spine Wo Contrast  Result Date: 08/09/2018 CLINICAL DATA:  Maxillofacial trauma after syncopal episode. Avulsed front tooth and laceration above lip. EXAM: CT HEAD WITHOUT CONTRAST CT MAXILLOFACIAL WITHOUT CONTRAST CT CERVICAL SPINE WITHOUT CONTRAST TECHNIQUE: Multidetector CT imaging of the head, cervical spine, and maxillofacial structures were performed using the standard protocol without intravenous contrast. Multiplanar CT image reconstructions of the cervical spine and maxillofacial structures were also generated. COMPARISON:  None. FINDINGS: CT HEAD FINDINGS Brain: Age related involutional changes of the brain without acute intracranial hemorrhage, midline shift or edema. No large vascular territory infarction. No hydrocephalus. No intra-axial mass nor extra-axial fluid. Vascular: No hyperdense vessel sign. Minimal atherosclerosis of the skull base. Skull: No acute skull fracture. Please see the maxillofacial CT report below for details on facial fractures. Other: Soft tissue swelling of the upper lip. CT MAXILLOFACIAL FINDINGS Osseous: The following acute fractures are identified: 1. Bilateral nasal bone fractures with minimal displacement of the tip of the right nasal bone laterally. 2. Fractures of the anterior, lateral and medial wall of the left maxillary sinus with minimal posterior  displacement of the anterior wall fracture fragment by 3 mm. 3. Fracture at the junction of the left zygomatic arch and maxillary sinus, series 10/49. The remainder of the zygomatic arch appears intact. 4. Fractures of the lateral pterygoid plates bilaterally. 5. Fracture of the lateral wall as well as orbital floor of the left orbit without significant displacement. 6. Avulsed left upper central incisor. 7. Fracture of the nasal septum along its superior aspect, series 14/39. Orbits: Intact optic nerves, extraocular muscles and globes. No lens did attachments. Sinuses: Blood products opacifying much of the left maxillary sinus and moderately opacifying the ethmoid sinus. The right maxillary and sphenoid sinuses are clear as is the frontal sinus. Soft tissues: Soft tissue swelling of the upper lip. Mild left supraorbital soft tissue swelling. CT CERVICAL SPINE FINDINGS Alignment: Slight straightening of the cervical spine possibly positional or due to muscle spasm. Skull base and vertebrae: No acute fracture. No primary bone lesion or focal pathologic  process. Soft tissues and spinal canal: No prevertebral fluid or swelling. No visible canal hematoma. Disc levels: Moderate disc flattening C4 through C7 with posterior marginal osteophytes and uncovertebral joint osteoarthritis. Multilevel degenerative facet arthropathy. No significant central or foraminal encroachment. Upper chest: Negative. Other: None IMPRESSION: CT head: No acute intracranial abnormality. CT maxillofacial: Upper lip and supraorbital soft tissue swelling associated with fractures as below: 1. Bilateral nasal bone fractures with minimal displacement of the tip of the right nasal bone laterally. 2. Fractures of the anterior, lateral and medial wall of the left maxillary sinus with minimal posterior displacement of the anterior wall fracture fragment by 3 mm. 3. Fracture at the junction of the left zygomatic arch and maxillary sinus, series 10/49. The  remainder of the zygomatic arch appears intact. 4. Fractures of the lateral pterygoid plates bilaterally. 5. Fracture of the lateral wall as well as orbital floor of the left orbit without significant displacement. 6. Avulsed left upper central incisor. 7. Fracture of the nasal septum along its superior aspect, series 14/39. Posttraumatic opacification of the left maxillary sinus with blood products as well as moderate opacification of the ethmoid sinus. CT cervical spine: No acute cervical spine fracture. Degenerative disc disease C4 through C7. Electronically Signed   By: Ashley Royalty M.D.   On: 08/09/2018 00:40   Mr Brain Wo Contrast  Result Date: 08/09/2018 CLINICAL DATA:  72 year old male with facial trauma after syncopal episode and fall. EXAM: MRI HEAD WITHOUT CONTRAST TECHNIQUE: Multiplanar, multiecho pulse sequences of the brain and surrounding structures were obtained without intravenous contrast. COMPARISON:  Head face and cervical spine CTs 08/08/2018. Brain MRI 01/15/2018. FINDINGS: Brain: No restricted diffusion to suggest acute infarction. No midline shift, mass effect, evidence of mass lesion, ventriculomegaly, extra-axial collection or acute intracranial hemorrhage. Cervicomedullary junction and pituitary are within normal limits. Mild to moderate for age scattered and occasionally patchy cerebral white matter T2 and FLAIR hyperintensity appears stable since February. No cortical encephalomalacia or chronic cerebral blood products identified. Deep gray matter nuclei, brainstem, and cerebellum appear normal for age. Vascular: Major intracranial vascular flow voids are stable and appear normal. Skull and upper cervical spine: Negative visible cervical spine. Normal bone marrow signal. Sinuses/Orbits: Intraorbital soft tissues appear stable and normal. There is a hemorrhage level in the left maxillary sinus. The remaining paranasal sinuses are well pneumatized. Other: Mastoid air cells remain clear.  Visible internal auditory structures appear normal. IMPRESSION: 1.  No acute intracranial abnormality. Stable MRI appearance of the brain since February with mild to moderate for age nonspecific cerebral white matter signal changes. 2. Layering hemorrhage in the left maxillary sinus related to facial fractures demonstrated yesterday. Electronically Signed   By: Genevie Ann M.D.   On: 08/09/2018 10:43   Ct Maxillofacial Wo Contrast  Result Date: 08/09/2018 CLINICAL DATA:  Maxillofacial trauma after syncopal episode. Avulsed front tooth and laceration above lip. EXAM: CT HEAD WITHOUT CONTRAST CT MAXILLOFACIAL WITHOUT CONTRAST CT CERVICAL SPINE WITHOUT CONTRAST TECHNIQUE: Multidetector CT imaging of the head, cervical spine, and maxillofacial structures were performed using the standard protocol without intravenous contrast. Multiplanar CT image reconstructions of the cervical spine and maxillofacial structures were also generated. COMPARISON:  None. FINDINGS: CT HEAD FINDINGS Brain: Age related involutional changes of the brain without acute intracranial hemorrhage, midline shift or edema. No large vascular territory infarction. No hydrocephalus. No intra-axial mass nor extra-axial fluid. Vascular: No hyperdense vessel sign. Minimal atherosclerosis of the skull base. Skull: No acute skull fracture. Please see  the maxillofacial CT report below for details on facial fractures. Other: Soft tissue swelling of the upper lip. CT MAXILLOFACIAL FINDINGS Osseous: The following acute fractures are identified: 1. Bilateral nasal bone fractures with minimal displacement of the tip of the right nasal bone laterally. 2. Fractures of the anterior, lateral and medial wall of the left maxillary sinus with minimal posterior displacement of the anterior wall fracture fragment by 3 mm. 3. Fracture at the junction of the left zygomatic arch and maxillary sinus, series 10/49. The remainder of the zygomatic arch appears intact. 4. Fractures  of the lateral pterygoid plates bilaterally. 5. Fracture of the lateral wall as well as orbital floor of the left orbit without significant displacement. 6. Avulsed left upper central incisor. 7. Fracture of the nasal septum along its superior aspect, series 14/39. Orbits: Intact optic nerves, extraocular muscles and globes. No lens did attachments. Sinuses: Blood products opacifying much of the left maxillary sinus and moderately opacifying the ethmoid sinus. The right maxillary and sphenoid sinuses are clear as is the frontal sinus. Soft tissues: Soft tissue swelling of the upper lip. Mild left supraorbital soft tissue swelling. CT CERVICAL SPINE FINDINGS Alignment: Slight straightening of the cervical spine possibly positional or due to muscle spasm. Skull base and vertebrae: No acute fracture. No primary bone lesion or focal pathologic process. Soft tissues and spinal canal: No prevertebral fluid or swelling. No visible canal hematoma. Disc levels: Moderate disc flattening C4 through C7 with posterior marginal osteophytes and uncovertebral joint osteoarthritis. Multilevel degenerative facet arthropathy. No significant central or foraminal encroachment. Upper chest: Negative. Other: None IMPRESSION: CT head: No acute intracranial abnormality. CT maxillofacial: Upper lip and supraorbital soft tissue swelling associated with fractures as below: 1. Bilateral nasal bone fractures with minimal displacement of the tip of the right nasal bone laterally. 2. Fractures of the anterior, lateral and medial wall of the left maxillary sinus with minimal posterior displacement of the anterior wall fracture fragment by 3 mm. 3. Fracture at the junction of the left zygomatic arch and maxillary sinus, series 10/49. The remainder of the zygomatic arch appears intact. 4. Fractures of the lateral pterygoid plates bilaterally. 5. Fracture of the lateral wall as well as orbital floor of the left orbit without significant displacement.  6. Avulsed left upper central incisor. 7. Fracture of the nasal septum along its superior aspect, series 14/39. Posttraumatic opacification of the left maxillary sinus with blood products as well as moderate opacification of the ethmoid sinus. CT cervical spine: No acute cervical spine fracture. Degenerative disc disease C4 through C7. Electronically Signed   By: Ashley Royalty M.D.   On: 08/09/2018 00:40      Subjective: Denies complaints.  Denies pain.  Has not taken any pain medications here.  Facial swelling improved.  Reports that he has been ambulating in the room without dizziness, lightheadedness or feeling like he is going to pass out.  No vertiginous symptoms.  No tremulousness or anxiety.  As per RN, no acute issues noted.  Discharge Exam:  Vitals:   08/09/18 1546 08/09/18 1936 08/10/18 0020 08/10/18 0401  BP: (!) 144/93 138/85 129/84 (!) 141/77  Pulse: 84 91 80 79  Resp: 18 18 20 18   Temp: 98.6 F (37 C) 97.6 F (36.4 C) 98.9 F (37.2 C) 98.5 F (36.9 C)  TempSrc: Oral Oral Oral Oral  SpO2: 97% 97% 93% 95%  Weight:    83.6 kg  Height:      Lying: BP 123/88, pulse 80.  Sitting: BP 105/75, pulse 84.  Standing: BP 96/74, pulse 110.  General exam: Pleasant elderly male, moderately built and nourished, sitting up comfortably in bed. Respiratory system: Clear to auscultation. Respiratory effort normal. Cardiovascular system: S1 & S2 heard, RRR. No JVD, murmurs, rubs, gallops or clicks. No pedal edema.   Telemetry personally reviewed: Sinus rhythm with occasional PVCs. Gastrointestinal system: Abdomen is nondistended, soft and nontender. No organomegaly or masses felt. Normal bowel sounds heard. Central nervous system: Alert and oriented. No focal neurological deficits. Extremities: Symmetric 5 x 5 power. Skin: No rashes, lesions or ulcers Psychiatry: Judgement and insight appear normal. Mood & affect appropriate.  HEENT:  Looks better.  Diminished swelling of right side of face.   Mild diffuse swelling of left side of face.  Multiple areas of facial bruising/abrasions.  Nose slightly displaced to left.  Missing anterior upper teeth.    The results of significant diagnostics from this hospitalization (including imaging, microbiology, ancillary and laboratory) are listed below for reference.       Labs: CBC: Recent Labs  Lab 08/08/18 2326 08/09/18 0551 08/10/18 0523  WBC 6.9 11.0* 7.0  HGB 13.7 14.2 13.0  HCT 41.0 41.9 38.5*  MCV 101.2* 99.8 99.5  PLT 197 258 553   Basic Metabolic Panel: Recent Labs  Lab 08/08/18 2326 08/09/18 0551 08/10/18 0523  NA 134* 134* 136  K 3.7 4.8 3.9  CL 98 97* 103  CO2 20* 25 23  GLUCOSE 102* 97 113*  BUN 19 17 6*  CREATININE 1.30* 1.01 0.87  CALCIUM 8.8* 8.8* 8.3*  MG  --   --  1.9   CBG: Recent Labs  Lab 08/09/18 0839 08/10/18 0758  GLUCAP 83 94    Urinalysis    Component Value Date/Time   COLORURINE YELLOW 08/09/2018 [place] 08/09/2018 0644   LABSPEC 1.018 08/09/2018 0644   PHURINE 5.0 08/09/2018 0644   GLUCOSEU NEGATIVE 08/09/2018 0644   HGBUR NEGATIVE 08/09/2018 0644   BILIRUBINUR NEGATIVE 08/09/2018 0644   KETONESUR 20 (A) 08/09/2018 0644   PROTEINUR NEGATIVE 08/09/2018 0644   NITRITE NEGATIVE 08/09/2018 0644   LEUKOCYTESUR NEGATIVE 08/09/2018 0644    I offered to call patient's family to discuss and update care.  Patient politely declined offer and stated that he would update them and moreover he stated that I would not be able to reach them because they are in a meeting in [place].  Time coordinating discharge: 35 minutes  SIGNED:  Vernell Leep, MD, FACP, [facility]. [facility] Pager [phone]  If 7PM-7AM, please contact night-coverage [email or web address] Password [facility] 08/10/2018, 12:56 PM

## 2018-08-11 DIAGNOSIS — Z6823 Body mass index (BMI) 23.0-23.9, adult: Secondary | ICD-10-CM | POA: Diagnosis not present

## 2018-08-11 DIAGNOSIS — Z23 Encounter for immunization: Secondary | ICD-10-CM | POA: Diagnosis not present

## 2018-08-11 DIAGNOSIS — R55 Syncope and collapse: Secondary | ICD-10-CM | POA: Diagnosis not present

## 2018-08-11 DIAGNOSIS — R42 Dizziness and giddiness: Secondary | ICD-10-CM | POA: Diagnosis not present

## 2018-08-11 DIAGNOSIS — E7849 Other hyperlipidemia: Secondary | ICD-10-CM | POA: Diagnosis not present

## 2018-08-11 DIAGNOSIS — R69 Illness, unspecified: Secondary | ICD-10-CM | POA: Diagnosis not present

## 2018-08-11 DIAGNOSIS — M7021 Olecranon bursitis, right elbow: Secondary | ICD-10-CM | POA: Diagnosis not present

## 2018-08-11 DIAGNOSIS — J309 Allergic rhinitis, unspecified: Secondary | ICD-10-CM | POA: Diagnosis not present

## 2018-08-11 DIAGNOSIS — G250 Essential tremor: Secondary | ICD-10-CM | POA: Diagnosis not present

## 2018-08-11 DIAGNOSIS — H8112 Benign paroxysmal vertigo, left ear: Secondary | ICD-10-CM | POA: Diagnosis not present

## 2018-08-11 DIAGNOSIS — I10 Essential (primary) hypertension: Secondary | ICD-10-CM | POA: Diagnosis not present

## 2018-08-14 DIAGNOSIS — S022XXD Fracture of nasal bones, subsequent encounter for fracture with routine healing: Secondary | ICD-10-CM | POA: Diagnosis not present

## 2018-08-14 DIAGNOSIS — M25511 Pain in right shoulder: Secondary | ICD-10-CM | POA: Diagnosis not present

## 2018-08-26 DIAGNOSIS — L905 Scar conditions and fibrosis of skin: Secondary | ICD-10-CM | POA: Diagnosis not present

## 2018-08-28 DIAGNOSIS — M25511 Pain in right shoulder: Secondary | ICD-10-CM | POA: Diagnosis not present

## 2018-09-03 DIAGNOSIS — H2513 Age-related nuclear cataract, bilateral: Secondary | ICD-10-CM | POA: Diagnosis not present

## 2018-09-03 DIAGNOSIS — H52203 Unspecified astigmatism, bilateral: Secondary | ICD-10-CM | POA: Diagnosis not present

## 2018-09-03 DIAGNOSIS — H5203 Hypermetropia, bilateral: Secondary | ICD-10-CM | POA: Diagnosis not present

## 2018-09-15 DIAGNOSIS — R69 Illness, unspecified: Secondary | ICD-10-CM | POA: Diagnosis not present

## 2018-10-27 DIAGNOSIS — R69 Illness, unspecified: Secondary | ICD-10-CM | POA: Diagnosis not present

## 2019-01-13 DIAGNOSIS — D1801 Hemangioma of skin and subcutaneous tissue: Secondary | ICD-10-CM | POA: Diagnosis not present

## 2019-01-13 DIAGNOSIS — D2261 Melanocytic nevi of right upper limb, including shoulder: Secondary | ICD-10-CM | POA: Diagnosis not present

## 2019-01-13 DIAGNOSIS — D2272 Melanocytic nevi of left lower limb, including hip: Secondary | ICD-10-CM | POA: Diagnosis not present

## 2019-01-13 DIAGNOSIS — B353 Tinea pedis: Secondary | ICD-10-CM | POA: Diagnosis not present

## 2019-01-13 DIAGNOSIS — L821 Other seborrheic keratosis: Secondary | ICD-10-CM | POA: Diagnosis not present

## 2019-01-13 DIAGNOSIS — Z85820 Personal history of malignant melanoma of skin: Secondary | ICD-10-CM | POA: Diagnosis not present

## 2019-01-13 DIAGNOSIS — D225 Melanocytic nevi of trunk: Secondary | ICD-10-CM | POA: Diagnosis not present

## 2019-01-13 DIAGNOSIS — D2262 Melanocytic nevi of left upper limb, including shoulder: Secondary | ICD-10-CM | POA: Diagnosis not present

## 2019-01-22 DIAGNOSIS — I10 Essential (primary) hypertension: Secondary | ICD-10-CM | POA: Diagnosis not present

## 2019-01-22 DIAGNOSIS — R05 Cough: Secondary | ICD-10-CM | POA: Diagnosis not present

## 2019-01-22 DIAGNOSIS — J069 Acute upper respiratory infection, unspecified: Secondary | ICD-10-CM | POA: Diagnosis not present

## 2019-02-16 DIAGNOSIS — Z125 Encounter for screening for malignant neoplasm of prostate: Secondary | ICD-10-CM | POA: Diagnosis not present

## 2019-02-16 DIAGNOSIS — I10 Essential (primary) hypertension: Secondary | ICD-10-CM | POA: Diagnosis not present

## 2019-02-16 DIAGNOSIS — E7849 Other hyperlipidemia: Secondary | ICD-10-CM | POA: Diagnosis not present

## 2019-02-23 DIAGNOSIS — I10 Essential (primary) hypertension: Secondary | ICD-10-CM | POA: Diagnosis not present

## 2019-02-23 DIAGNOSIS — R42 Dizziness and giddiness: Secondary | ICD-10-CM | POA: Diagnosis not present

## 2019-02-23 DIAGNOSIS — E785 Hyperlipidemia, unspecified: Secondary | ICD-10-CM | POA: Diagnosis not present

## 2019-02-23 DIAGNOSIS — G250 Essential tremor: Secondary | ICD-10-CM | POA: Diagnosis not present

## 2019-02-23 DIAGNOSIS — H8112 Benign paroxysmal vertigo, left ear: Secondary | ICD-10-CM | POA: Diagnosis not present

## 2019-02-23 DIAGNOSIS — R55 Syncope and collapse: Secondary | ICD-10-CM | POA: Diagnosis not present

## 2019-02-23 DIAGNOSIS — Z0000 Encounter for general adult medical examination without abnormal findings: Secondary | ICD-10-CM | POA: Diagnosis not present

## 2019-02-23 DIAGNOSIS — J309 Allergic rhinitis, unspecified: Secondary | ICD-10-CM | POA: Diagnosis not present

## 2019-02-23 DIAGNOSIS — Z1331 Encounter for screening for depression: Secondary | ICD-10-CM | POA: Diagnosis not present

## 2019-02-23 DIAGNOSIS — M7021 Olecranon bursitis, right elbow: Secondary | ICD-10-CM | POA: Diagnosis not present

## 2019-03-03 IMAGING — CT CT MAXILLOFACIAL W/O CM
5 of 14 series · 15 of 47 positions shown, 16 images · non-contrast
Comparison: None.

CLINICAL DATA: Maxillofacial trauma after syncopal episode. Avulsed
front tooth and laceration above lip.

EXAM:
CT HEAD WITHOUT CONTRAST
CT MAXILLOFACIAL WITHOUT CONTRAST
CT CERVICAL SPINE WITHOUT CONTRAST
TECHNIQUE: Multidetector CT imaging of the head, cervical spine, and
maxillofacial structures were performed using the standard protocol
without intravenous contrast. Multiplanar CT image reconstructions
of the cervical spine and maxillofacial structures were also
generated.

[Series 4: head bone · axial · 0.45mm/px · z∈[-158,-50]mm · 4 of 90 slices shown, 5 images]
[im 18/90  brain]
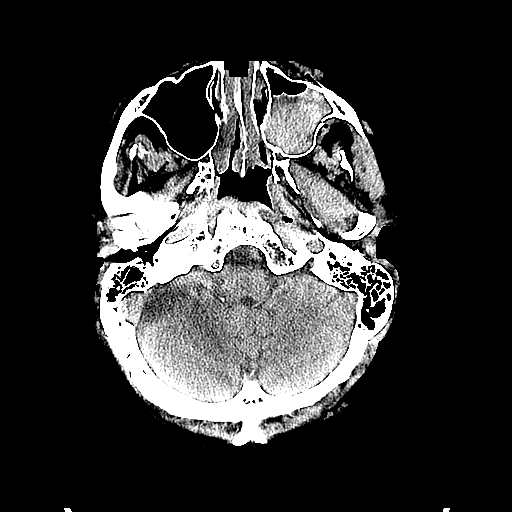
[im 18/90  bone]
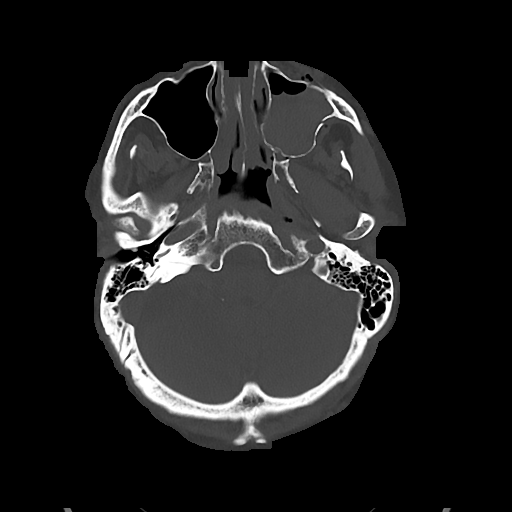
[im 36/90  bone]
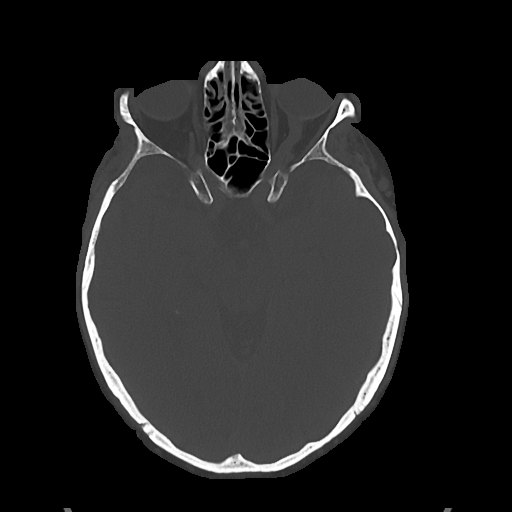
[im 54/90  bone]
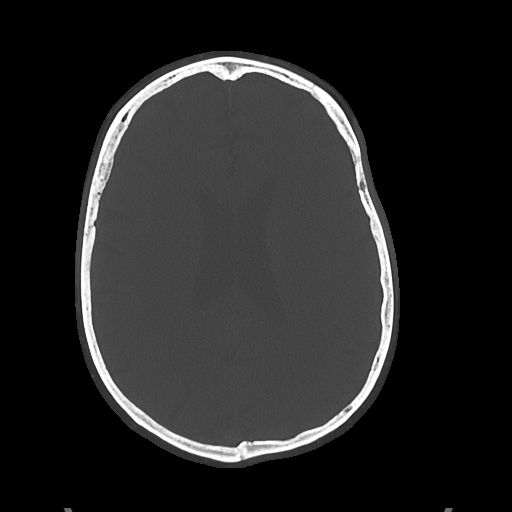
[im 72/90  bone]
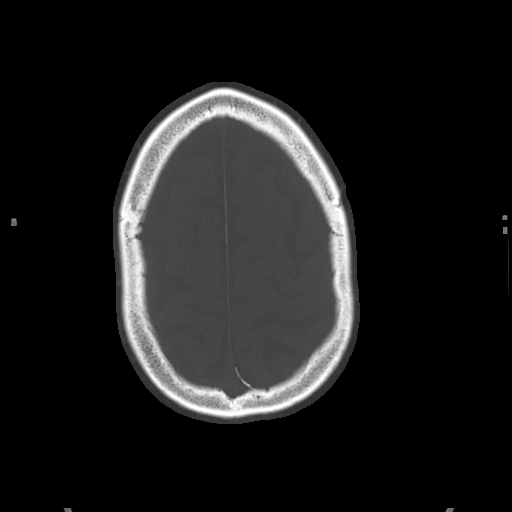

[Series 7: sag soft · sagittal · 0.35mm/px · 1 of 62 slices shown]
[im 31/62  bone]
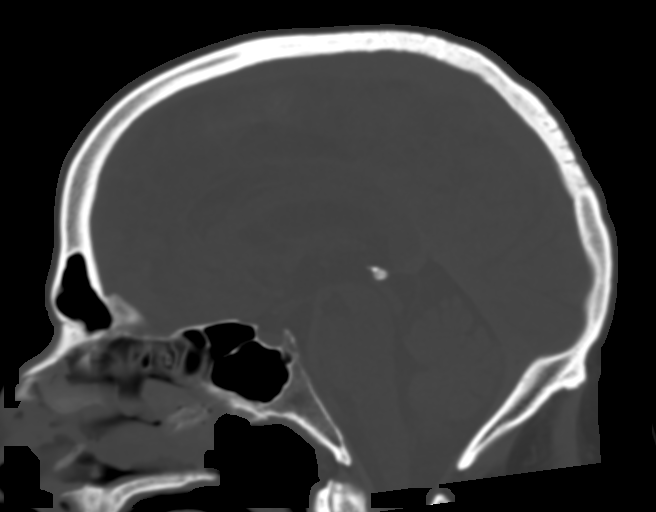

[Series 8: maxilllofacial 2.0 hr40 3 · axial · 0.35mm/px · z∈[-228,-120]mm · 4 of 90 slices shown]
[im 18/90  bone]
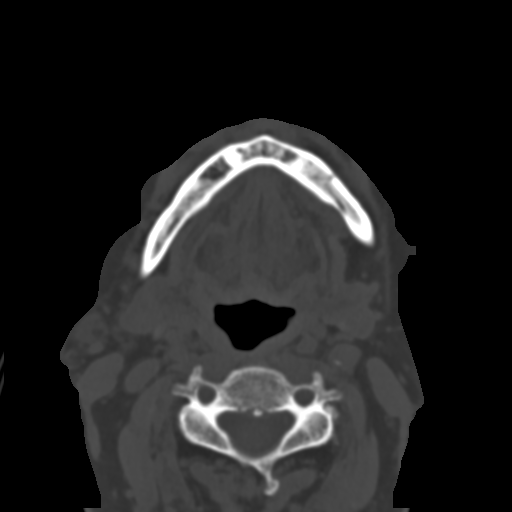
[im 36/90  bone]
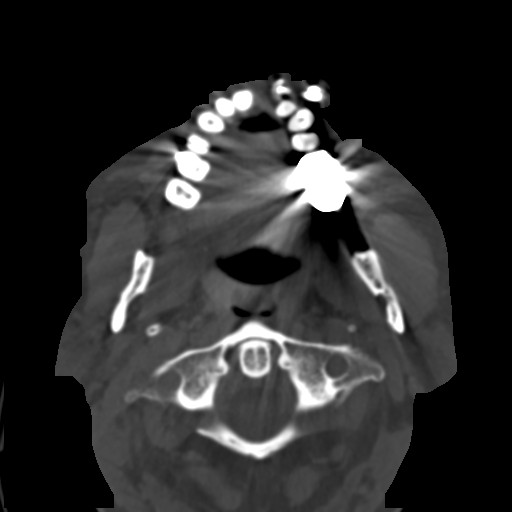
[im 54/90  bone]
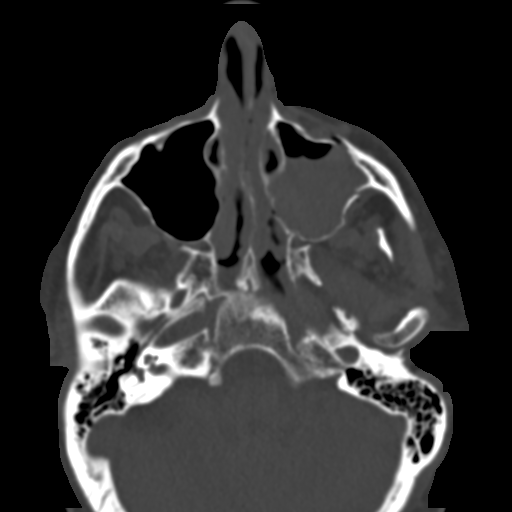
[im 72/90  bone]
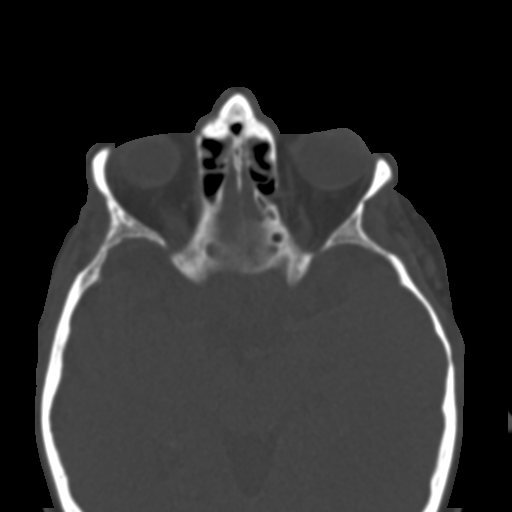

[Series 10: maxilllofacial 2.0 hr59 3 · axial · 0.35mm/px · z∈[-228,-120]mm · 4 of 90 slices shown]
[im 18/90  bone]
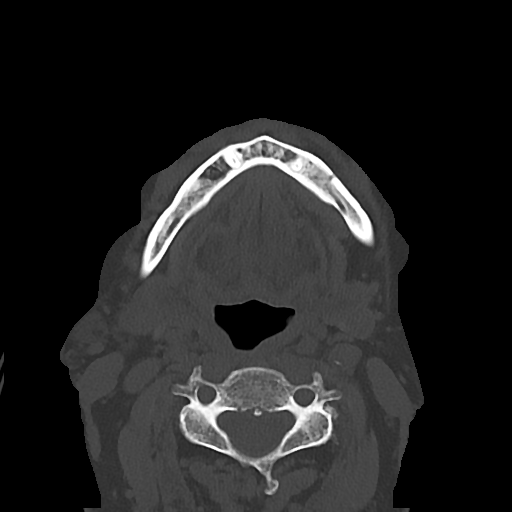
[im 36/90  bone]
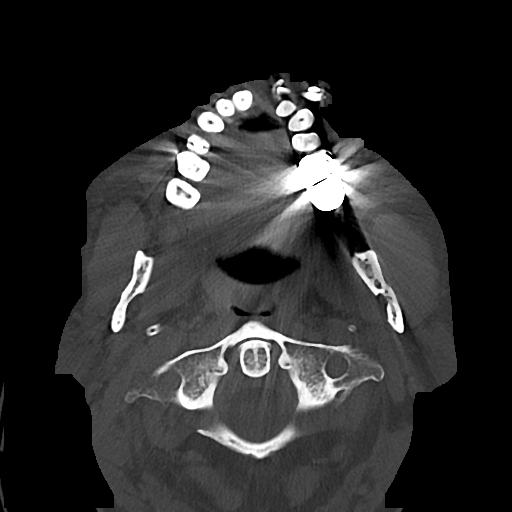
[im 54/90  bone]
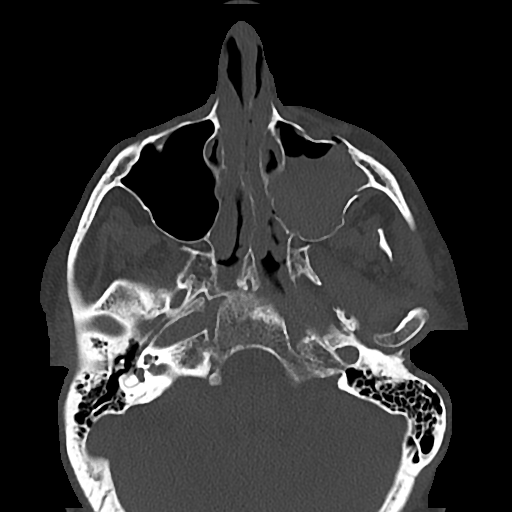
[im 72/90  bone]
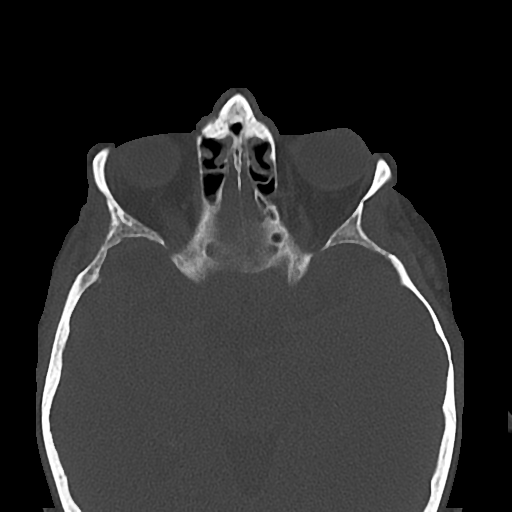

[Series 14: bone cor · coronal · 0.32mm/px · 2 of 90 slices shown]
[im 30/90  bone]
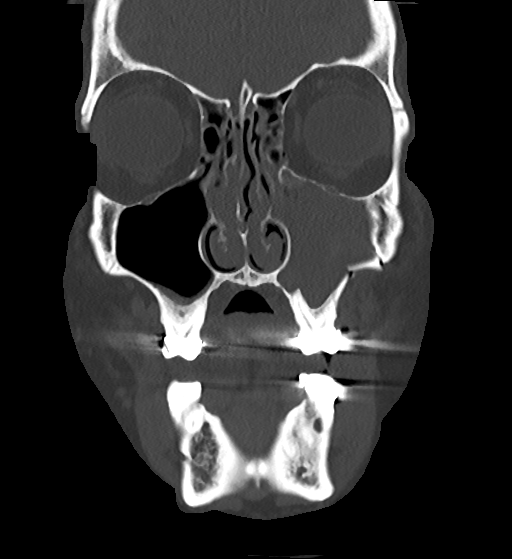
[im 60/90  bone]
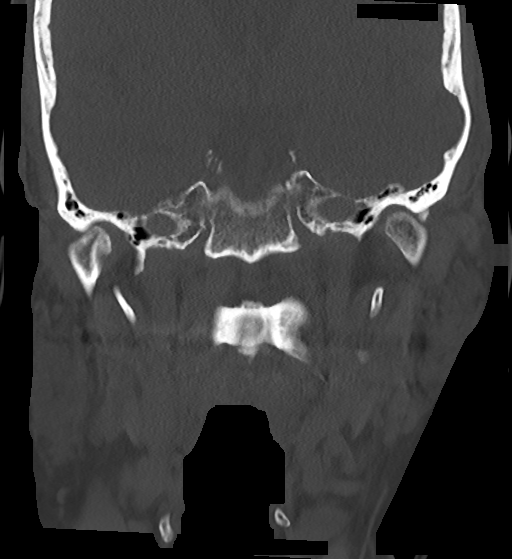

[15 of 47 positions shown; findings below may reference images not displayed]

FINDINGS: CT HEAD FINDINGS

Brain: Age related involutional changes of the brain without acute
intracranial hemorrhage, midline shift or edema. No large vascular
territory infarction. No hydrocephalus. No intra-axial mass nor
extra-axial fluid.

Vascular: No hyperdense vessel sign. Minimal atherosclerosis of the
skull base.

Skull: No acute skull fracture. Please see the maxillofacial CT
report below for details on facial fractures.

Other: Soft tissue swelling of the upper lip.

CT MAXILLOFACIAL FINDINGS

Osseous: The following acute fractures are identified:

1. Bilateral nasal bone fractures with minimal displacement of the
tip of the right nasal bone laterally.
2. Fractures of the anterior, lateral and medial wall of the left
maxillary sinus with minimal posterior displacement of the anterior
wall fracture fragment by 3 mm.
3. Fracture at the junction of the left zygomatic arch and maxillary
sinus, series [DATE]. The remainder of the zygomatic arch appears
intact.
4. Fractures of the lateral pterygoid plates bilaterally.
5. Fracture of the lateral wall as well as orbital floor of the left
orbit without significant displacement.
6. Avulsed left upper central incisor.
7. Fracture of the nasal septum along its superior aspect, series
[DATE].

Orbits: Intact optic nerves, extraocular muscles and globes. No lens
did attachments.

Sinuses: Blood products opacifying much of the left maxillary sinus
and moderately opacifying the ethmoid sinus. The right maxillary and
sphenoid sinuses are clear as is the frontal sinus.

Soft tissues: Soft tissue swelling of the upper lip. Mild left
supraorbital soft tissue swelling.

CT CERVICAL SPINE FINDINGS

Alignment: Slight straightening of the cervical spine possibly
positional or due to muscle spasm.

Skull base and vertebrae: No acute fracture. No primary bone lesion
or focal pathologic process.

Soft tissues and spinal canal: No prevertebral fluid or swelling. No
visible canal hematoma.

Disc levels: Moderate disc flattening C4 through C7 with posterior
marginal osteophytes and uncovertebral joint osteoarthritis.
Multilevel degenerative facet arthropathy. No significant central or
foraminal encroachment.

Upper chest: Negative.

Other: None
IMPRESSION: CT head:

No acute intracranial abnormality.

CT maxillofacial:

Upper lip and supraorbital soft tissue swelling associated with
fractures as below:

1. Bilateral nasal bone fractures with minimal displacement of the
tip of the right nasal bone laterally.
2. Fractures of the anterior, lateral and medial wall of the left
maxillary sinus with minimal posterior displacement of the anterior
wall fracture fragment by 3 mm.
3. Fracture at the junction of the left zygomatic arch and maxillary
sinus, series [DATE]. The remainder of the zygomatic arch appears
intact.
4. Fractures of the lateral pterygoid plates bilaterally.
5. Fracture of the lateral wall as well as orbital floor of the left
orbit without significant displacement.
6. Avulsed left upper central incisor.
7. Fracture of the nasal septum along its superior aspect, series
[DATE].

Posttraumatic opacification of the left maxillary sinus with blood
products as well as moderate opacification of the ethmoid sinus.

CT cervical spine:

No acute cervical spine fracture. Degenerative disc disease C4
through C7.

## 2019-03-04 IMAGING — DX DG CHEST 2V
2 series · 2 of 2 positions shown · non-contrast
Comparison: None.

CLINICAL DATA: Syncope, fall, left rib pain

EXAM:
CHEST - 2 VIEW

[chest lat]
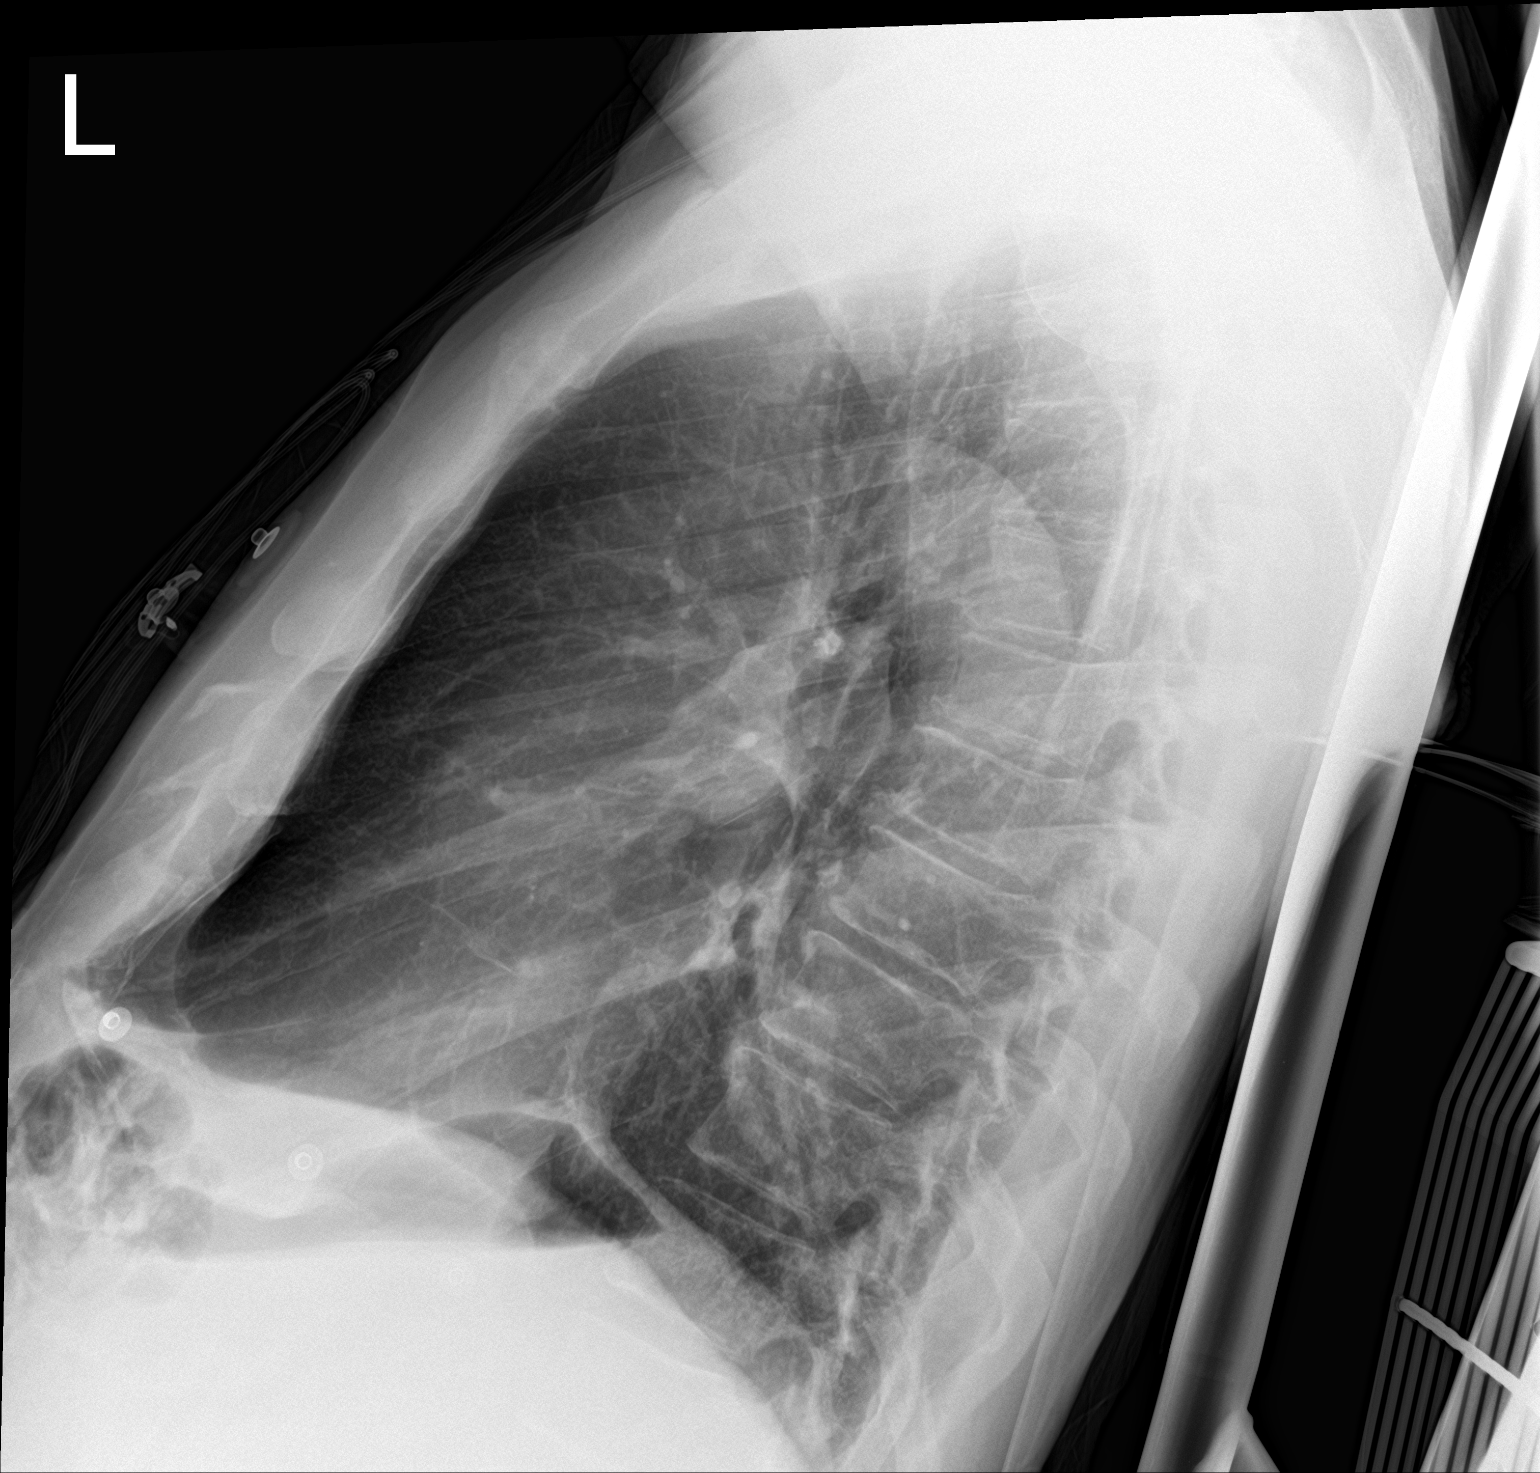

[chest ap]
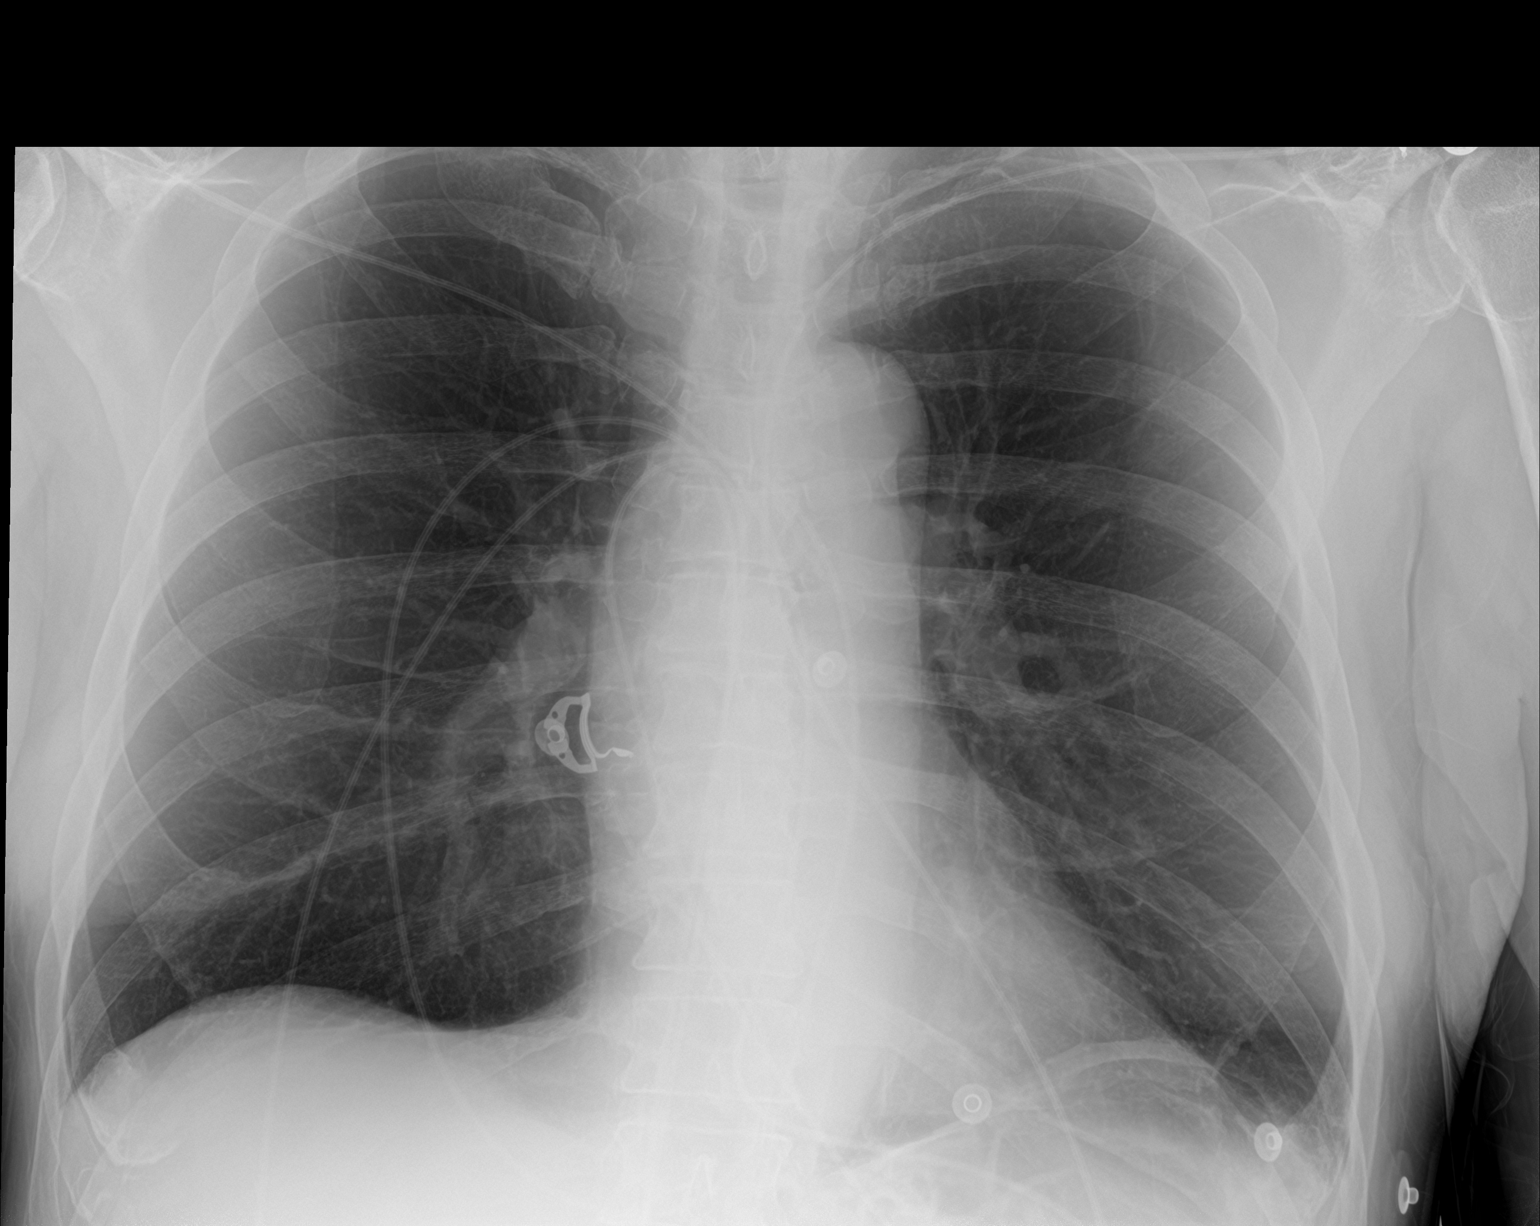

[2 of 2 positions shown; findings below may reference images not displayed]

FINDINGS: Lungs are clear.  No pleural effusion or pneumothorax.

The heart is normal in size.

Visualized osseous structures are within normal limits.
IMPRESSION: Normal chest radiographs.

## 2019-05-14 DIAGNOSIS — B353 Tinea pedis: Secondary | ICD-10-CM | POA: Diagnosis not present

## 2019-05-14 DIAGNOSIS — L853 Xerosis cutis: Secondary | ICD-10-CM | POA: Diagnosis not present

## 2019-05-14 DIAGNOSIS — L820 Inflamed seborrheic keratosis: Secondary | ICD-10-CM | POA: Diagnosis not present

## 2019-06-09 ENCOUNTER — Emergency Department (HOSPITAL_COMMUNITY)
Admission: EM | Admit: 2019-06-09 | Discharge: 2019-06-10 | Disposition: A | Discharge status: Home / Self Care | Payer: Medicare HMO | Attending: Emergency Medicine | Admitting: Emergency Medicine

## 2019-06-09 ENCOUNTER — Encounter (HOSPITAL_COMMUNITY): Payer: Self-pay

## 2019-06-09 ENCOUNTER — Other Ambulatory Visit: Payer: Self-pay

## 2019-06-09 ENCOUNTER — Emergency Department (HOSPITAL_COMMUNITY): Payer: Medicare HMO

## 2019-06-09 DIAGNOSIS — S0101XA Laceration without foreign body of scalp, initial encounter: Secondary | ICD-10-CM

## 2019-06-09 DIAGNOSIS — Z79899 Other long term (current) drug therapy: Secondary | ICD-10-CM | POA: Insufficient documentation

## 2019-06-09 DIAGNOSIS — E860 Dehydration: Secondary | ICD-10-CM | POA: Diagnosis not present

## 2019-06-09 DIAGNOSIS — Z7982 Long term (current) use of aspirin: Secondary | ICD-10-CM | POA: Diagnosis not present

## 2019-06-09 DIAGNOSIS — R55 Syncope and collapse: Secondary | ICD-10-CM | POA: Diagnosis not present

## 2019-06-09 DIAGNOSIS — Z789 Other specified health status: Secondary | ICD-10-CM

## 2019-06-09 DIAGNOSIS — Y999 Unspecified external cause status: Secondary | ICD-10-CM | POA: Diagnosis not present

## 2019-06-09 DIAGNOSIS — Z23 Encounter for immunization: Secondary | ICD-10-CM | POA: Diagnosis not present

## 2019-06-09 DIAGNOSIS — F1010 Alcohol abuse, uncomplicated: Secondary | ICD-10-CM | POA: Diagnosis not present

## 2019-06-09 DIAGNOSIS — Y9389 Activity, other specified: Secondary | ICD-10-CM | POA: Diagnosis not present

## 2019-06-09 DIAGNOSIS — S199XXA Unspecified injury of neck, initial encounter: Secondary | ICD-10-CM | POA: Diagnosis not present

## 2019-06-09 DIAGNOSIS — S0990XA Unspecified injury of head, initial encounter: Secondary | ICD-10-CM | POA: Diagnosis not present

## 2019-06-09 DIAGNOSIS — Z7289 Other problems related to lifestyle: Secondary | ICD-10-CM

## 2019-06-09 DIAGNOSIS — Y929 Unspecified place or not applicable: Secondary | ICD-10-CM | POA: Insufficient documentation

## 2019-06-09 DIAGNOSIS — W19XXXA Unspecified fall, initial encounter: Secondary | ICD-10-CM | POA: Diagnosis not present

## 2019-06-09 DIAGNOSIS — S022XXA Fracture of nasal bones, initial encounter for closed fracture: Secondary | ICD-10-CM

## 2019-06-09 DIAGNOSIS — I10 Essential (primary) hypertension: Secondary | ICD-10-CM | POA: Insufficient documentation

## 2019-06-09 DIAGNOSIS — R69 Illness, unspecified: Secondary | ICD-10-CM | POA: Diagnosis not present

## 2019-06-09 LAB — CBC
HCT: 41.5 % (ref 39.0–52.0)
Hemoglobin: 13.8 g/dL (ref 13.0–17.0)
MCH: 33.3 pg (ref 26.0–34.0)
MCHC: 33.3 g/dL (ref 30.0–36.0)
MCV: 100.2 fL — ABNORMAL HIGH (ref 80.0–100.0)
Platelets: 257 10*3/uL (ref 150–400)
RBC: 4.14 MIL/uL — ABNORMAL LOW (ref 4.22–5.81)
RDW: 12.1 % (ref 11.5–15.5)
WBC: 6.4 10*3/uL (ref 4.0–10.5)
nRBC: 0 % (ref 0.0–0.2)

## 2019-06-09 LAB — BASIC METABOLIC PANEL
Anion gap: 11 (ref 5–15)
BUN: 15 mg/dL (ref 8–23)
CO2: 20 mmol/L — ABNORMAL LOW (ref 22–32)
Calcium: 8.9 mg/dL (ref 8.9–10.3)
Chloride: 101 mmol/L (ref 98–111)
Creatinine, Ser: 0.9 mg/dL (ref 0.61–1.24)
GFR calc Af Amer: >60 mL/min (ref >60)
GFR calc non Af Amer: >60 mL/min (ref >60)
Glucose, Bld: 100 mg/dL — ABNORMAL HIGH (ref 70–99)
Potassium: 4.1 mmol/L (ref 3.5–5.1)
Sodium: 132 mmol/L — ABNORMAL LOW (ref 135–145)

## 2019-06-09 LAB — URINALYSIS, ROUTINE W REFLEX MICROSCOPIC
Bacteria, UA: NONE SEEN
Bilirubin Urine: NEGATIVE
Glucose, UA: NEGATIVE mg/dL
Ketones, ur: NEGATIVE mg/dL
Leukocytes,Ua: NEGATIVE
Nitrite: NEGATIVE
Protein, ur: NEGATIVE mg/dL
Specific Gravity, Urine: 1.008 (ref 1.005–1.030)
pH: 5 (ref 5.0–8.0)

## 2019-06-09 LAB — ETHANOL: Alcohol, Ethyl (B): 224 mg/dL — ABNORMAL HIGH (ref <10)

## 2019-06-09 MED ORDER — SODIUM CHLORIDE 0.9% FLUSH: 3.0000 mL | Freq: Once | INTRAVENOUS | Status: DC

## 2019-06-09 NOTE — ED Triage Notes (Signed)
Pt reports syncopal episode tonight, pt admits to consuming alcohol tonight and does not completely remember what happened. Pt states he has hx of vertigo. Laceration noted to forehead, bleeding controlled, denies taking any blood thinners. Pt alert and oriented.

## 2019-06-10 LAB — CBG MONITORING, ED: Glucose-Capillary: 95 mg/dL (ref 70–99)

## 2019-06-10 MED ORDER — LIDOCAINE-EPINEPHRINE (PF) 2 %-1:200000 IJ SOLN
INTRAMUSCULAR | Status: AC
  Administered 2019-06-10: 04:00:00
  Filled 2019-06-10: qty 20

## 2019-06-10 MED ORDER — CEPHALEXIN 500 MG PO CAPS: 500.0000 mg | ORAL_CAPSULE | Freq: Two times a day (BID) | ORAL | 0 refills | Status: AC

## 2019-06-10 MED ORDER — LIDOCAINE-EPINEPHRINE-TETRACAINE (LET) SOLUTION
3.0000 mL | Freq: Once | NASAL | Status: AC
  Administered 2019-06-10: 3 mL via TOPICAL
  Filled 2019-06-10: qty 3

## 2019-06-10 MED ORDER — TETANUS-DIPHTH-ACELL PERTUSSIS 5-2.5-18.5 LF-MCG/0.5 IM SUSP
0.5000 mL | Freq: Once | INTRAMUSCULAR | Status: AC
  Administered 2019-06-10: 0.5 mL via INTRAMUSCULAR
  Filled 2019-06-10: qty 0.5

## 2019-06-10 NOTE — ED Provider Notes (Signed)
[facility] EMERGENCY DEPARTMENT Provider Note   CSN: [id number] Arrival date & time: 06/09/19  2222     History   Chief Complaint Chief Complaint  Patient presents with   Loss of Consciousness   Head Laceration    HPI Jordan Hicks is a 73 y.o. male.     The history is provided by the patient.  Loss of Consciousness Episode history:  Single Timing:  Constant Progression:  Improving Chronicity:  New Context: dehydration   Context comment:  Alcohol use Relieved by:  None tried Worsened by:  Nothing Associated symptoms: no chest pain, no fever, no headaches, no visual change and no vomiting   Head Laceration Pertinent negatives include no chest pain and no headaches.   Patient with history of hypertension/hyperlipidemia presents after a fall.  Patient does not have exact recall of the event, but he does know that he passed out.  He denies any fever/vomiting/chest pain/shortness of breath.  No headache or visual changes.  He does admit to drinking a few glasses of wine tonight.  He reports he sustained a laceration to his scalp and injured his nose in the fall.  He reports previous history of nasal fracture  Patient reports he does not take anticoagulants Past Medical History:  Diagnosis Date   Arthritis    lower back   Asthma    childhood   HTN (hypertension)    Hyperlipidemia    Sleep apnea    as stated by wife    Patient Active Problem List   Diagnosis Date Noted   AKI (acute kidney injury) ([place]) 08/09/2018   Syncope 08/09/2018   Fall 08/09/2018   Fracture of maxilla ([place]) 08/09/2018   Nasal bone fractures 08/09/2018   Pterygoid plate fracture ([place]) 08/09/2018   Orbital floor fracture ([place]) 08/09/2018   Fracture of incisor teeth 08/09/2018   Alcohol abuse 08/09/2018   HTN (hypertension)    Hyperlipidemia    Asthma    Multiple closed facial bone fractures ([place])    Vertigo 01/06/2018   Left shoulder pain  01/17/2012   Premature ventricular contractions 01/17/2012    Past Surgical History:  Procedure Laterality Date   BICEPS TENDON REPAIR Left    COLONOSCOPY  2007   HERNIA REPAIR     THROAT SURGERY     About 10-85yrs ago        Home Medications    Prior to Admission medications   Medication Sig Start Date End Date Taking? Authorizing Provider  aspirin 81 MG tablet Take 81 mg by mouth daily.    [provider]  folic acid (FOLVITE) 1 MG tablet Take 1 tablet (1 mg total) by mouth daily. 08/11/18   Hongalgi, Lenis Dickinson, MD  Multiple Vitamin (MULTIVITAMIN WITH MINERALS) TABS tablet Take 1 tablet by mouth daily.    [provider]  niacin 500 MG tablet Take 500 mg by mouth daily with breakfast.    [provider]  thiamine 100 MG tablet Take 1 tablet (100 mg total) by mouth daily. 08/11/18   Hongalgi, Lenis Dickinson, MD  valsartan (DIOVAN) 160 MG tablet Take 160 mg by mouth daily. 06/02/18   [provider]    Family History Family History  Problem Relation Age of Onset   Emphysema Mother    Heart attack Father    Colon cancer Neg Hx     Social History Social History   Tobacco Use   Smoking status: Never Smoker   Smokeless tobacco:  Never Used  Substance Use Topics   Alcohol use: Yes    Comment: 1 Bottle of Wine a Day   Drug use: No     Allergies   Lisinopril   Review of Systems Review of Systems  Constitutional: Negative for fever.  Cardiovascular: Positive for syncope. Negative for chest pain.  Gastrointestinal: Negative for vomiting.  Skin: Positive for wound.  Neurological: Positive for syncope. Negative for headaches.  All other systems reviewed and are negative.    Physical Exam Updated Vital Signs BP (!) 145/97 (BP Location: Right Arm)    Pulse 69    Temp 97.6 F (36.4 C) (Oral)    Resp 15    SpO2 100%   Physical Exam CONSTITUTIONAL: Well developed/well nourished HEAD: Vertical laceration to the central portion  of his forehead that extends into his scalp.  No active bleeding.  It appears clean.  No other signs of head trauma EYES: EOMI/PERRL ENMT: Bruising and swelling noted to bridge of nose.  There is a small abrasion to the bridge of his nose.  There is no septal hematoma.  No active bleeding, no signs of any dental injury NECK: supple no meningeal signs SPINE/BACK:entire spine nontender no bruising/crepitance/stepoffs noted to spine CV: S1/S2 noted, no murmurs/rubs/gallops noted LUNGS: Lungs are clear to auscultation bilaterally, no apparent distress ABDOMEN: soft, nontender, no rebound or guarding, bowel sounds noted throughout abdomen GU:no cva tenderness NEURO: Pt is awake/alert/appropriate, moves all extremitiesx4.  No facial droop.  GCS 15 EXTREMITIES: pulses normal/equal, full ROM, all other extremities/joints palpated/ranged and nontender SKIN: warm, color normal PSYCH: no abnormalities of mood noted, alert and oriented to situation   ED Treatments / Results  Labs (all labs ordered are listed, but only abnormal results are displayed) Labs Reviewed  BASIC METABOLIC PANEL - Abnormal; Notable for the following components:      Result Value   Sodium 132 (*)    CO2 20 (*)    Glucose, Bld 100 (*)    All other components within normal limits  CBC - Abnormal; Notable for the following components:   RBC 4.14 (*)    MCV 100.2 (*)    All other components within normal limits  URINALYSIS, ROUTINE W REFLEX MICROSCOPIC - Abnormal; Notable for the following components:   Color, Urine STRAW (*)    Hgb urine dipstick SMALL (*)    All other components within normal limits  ETHANOL - Abnormal; Notable for the following components:   Alcohol, Ethyl (B) 224 (*)    All other components within normal limits  CBG MONITORING, ED    EKG EKG Interpretation  Date/Time:  Wednesday June 10 2019 01:59:45 EDT Ventricular Rate:  69 PR Interval:    QRS Duration: 105 QT Interval:  422 QTC  Calculation: 449 R Axis:   68 Text Interpretation:  Sinus rhythm Borderline T abnormalities, anterior leads Confirmed by Ripley Fraise [phone]) on 06/10/2019 2:23:54 AM   Radiology Ct Head Wo Contrast  Result Date: 06/10/2019 CLINICAL DATA:  73 year old male with head trauma and syncope. EXAM: CT HEAD WITHOUT CONTRAST CT MAXILLOFACIAL WITHOUT CONTRAST CT CERVICAL SPINE WITHOUT CONTRAST TECHNIQUE: Multidetector CT imaging of the head, cervical spine, and maxillofacial structures were performed using the standard protocol without intravenous contrast. Multiplanar CT image reconstructions of the cervical spine and maxillofacial structures were also generated. COMPARISON:  Brain MRI dated 08/09/2018 and CT dated 08/08/2018 FINDINGS: CT HEAD FINDINGS Brain: There is mild age-related atrophy and chronic microvascular ischemic changes. There is no  acute intracranial hemorrhage. No mass effect or midline shift. No extra-axial fluid collection. Vascular: No hyperdense vessel or unexpected calcification. Skull: Normal. Negative for fracture or focal lesion. Other: Laceration of the skin over the midline forehead. No large hematoma. CT MAXILLOFACIAL FINDINGS Osseous: There are multiple bilateral nasal bone fractures, new since the prior CT. There is deviation of the nose to the right. There is fracture of the anterior bony septum with deviation to the right. Nondisplaced fracture of the junction of the left zygomatic arch with the maxillary bone likely related to prior fracture. No other acute fracture identified. There is lucency of the maxillary bone at the left central incisor tooth which may be related to prior fracture. Orbits: The globes and retro-orbital fat are preserved. Sinuses: Clear. Soft tissues: Mild soft tissue swelling over the nose. CT CERVICAL SPINE FINDINGS Alignment: There is straightening of normal cervical lordosis which may be positional or due to muscle spasm. No acute subluxation. Skull base  and vertebrae: No acute fracture. No primary bone lesion or focal pathologic process. Soft tissues and spinal canal: No prevertebral fluid or swelling. No visible canal hematoma. Disc levels: Degenerative changes with endplate irregularity and disc space narrowing. Upper chest: Negative. Other: Mild bilateral carotid bulb calcified plaques. IMPRESSION: 1. No acute intracranial hemorrhage. 2. No acute/traumatic cervical spine pathology. 3. Fractures of the nasal bone and anterior nasal septum with deviation of the nose to the right, new since the prior CT. Electronically Signed   By: Anner Crete M.D.   On: 06/10/2019 00:25   Ct Cervical Spine Wo Contrast  Result Date: 06/10/2019 CLINICAL DATA:  73 year old male with head trauma and syncope. EXAM: CT HEAD WITHOUT CONTRAST CT MAXILLOFACIAL WITHOUT CONTRAST CT CERVICAL SPINE WITHOUT CONTRAST TECHNIQUE: Multidetector CT imaging of the head, cervical spine, and maxillofacial structures were performed using the standard protocol without intravenous contrast. Multiplanar CT image reconstructions of the cervical spine and maxillofacial structures were also generated. COMPARISON:  Brain MRI dated 08/09/2018 and CT dated 08/08/2018 FINDINGS: CT HEAD FINDINGS Brain: There is mild age-related atrophy and chronic microvascular ischemic changes. There is no acute intracranial hemorrhage. No mass effect or midline shift. No extra-axial fluid collection. Vascular: No hyperdense vessel or unexpected calcification. Skull: Normal. Negative for fracture or focal lesion. Other: Laceration of the skin over the midline forehead. No large hematoma. CT MAXILLOFACIAL FINDINGS Osseous: There are multiple bilateral nasal bone fractures, new since the prior CT. There is deviation of the nose to the right. There is fracture of the anterior bony septum with deviation to the right. Nondisplaced fracture of the junction of the left zygomatic arch with the maxillary bone likely related to  prior fracture. No other acute fracture identified. There is lucency of the maxillary bone at the left central incisor tooth which may be related to prior fracture. Orbits: The globes and retro-orbital fat are preserved. Sinuses: Clear. Soft tissues: Mild soft tissue swelling over the nose. CT CERVICAL SPINE FINDINGS Alignment: There is straightening of normal cervical lordosis which may be positional or due to muscle spasm. No acute subluxation. Skull base and vertebrae: No acute fracture. No primary bone lesion or focal pathologic process. Soft tissues and spinal canal: No prevertebral fluid or swelling. No visible canal hematoma. Disc levels: Degenerative changes with endplate irregularity and disc space narrowing. Upper chest: Negative. Other: Mild bilateral carotid bulb calcified plaques. IMPRESSION: 1. No acute intracranial hemorrhage. 2. No acute/traumatic cervical spine pathology. 3. Fractures of the nasal bone and anterior nasal  septum with deviation of the nose to the right, new since the prior CT. Electronically Signed   By: Anner Crete M.D.   On: 06/10/2019 00:25   Ct Maxillofacial Wo Cm  Result Date: 06/10/2019 CLINICAL DATA:  73 year old male with head trauma and syncope. EXAM: CT HEAD WITHOUT CONTRAST CT MAXILLOFACIAL WITHOUT CONTRAST CT CERVICAL SPINE WITHOUT CONTRAST TECHNIQUE: Multidetector CT imaging of the head, cervical spine, and maxillofacial structures were performed using the standard protocol without intravenous contrast. Multiplanar CT image reconstructions of the cervical spine and maxillofacial structures were also generated. COMPARISON:  Brain MRI dated 08/09/2018 and CT dated 08/08/2018 FINDINGS: CT HEAD FINDINGS Brain: There is mild age-related atrophy and chronic microvascular ischemic changes. There is no acute intracranial hemorrhage. No mass effect or midline shift. No extra-axial fluid collection. Vascular: No hyperdense vessel or unexpected calcification. Skull: Normal.  Negative for fracture or focal lesion. Other: Laceration of the skin over the midline forehead. No large hematoma. CT MAXILLOFACIAL FINDINGS Osseous: There are multiple bilateral nasal bone fractures, new since the prior CT. There is deviation of the nose to the right. There is fracture of the anterior bony septum with deviation to the right. Nondisplaced fracture of the junction of the left zygomatic arch with the maxillary bone likely related to prior fracture. No other acute fracture identified. There is lucency of the maxillary bone at the left central incisor tooth which may be related to prior fracture. Orbits: The globes and retro-orbital fat are preserved. Sinuses: Clear. Soft tissues: Mild soft tissue swelling over the nose. CT CERVICAL SPINE FINDINGS Alignment: There is straightening of normal cervical lordosis which may be positional or due to muscle spasm. No acute subluxation. Skull base and vertebrae: No acute fracture. No primary bone lesion or focal pathologic process. Soft tissues and spinal canal: No prevertebral fluid or swelling. No visible canal hematoma. Disc levels: Degenerative changes with endplate irregularity and disc space narrowing. Upper chest: Negative. Other: Mild bilateral carotid bulb calcified plaques. IMPRESSION: 1. No acute intracranial hemorrhage. 2. No acute/traumatic cervical spine pathology. 3. Fractures of the nasal bone and anterior nasal septum with deviation of the nose to the right, new since the prior CT. Electronically Signed   By: Anner Crete M.D.   On: 06/10/2019 00:25    Procedures .Marland KitchenLaceration Repair  Date/Time: 06/10/2019 3:34 AM Performed by: Ripley Fraise, MD Authorized by: Ripley Fraise, MD   Consent:    Consent obtained:  Verbal   Consent given by:  Patient   Alternatives discussed:  No treatment Anesthesia (see MAR for exact dosages):    Anesthesia method:  Topical application and local infiltration   Topical anesthetic:  LET    Local anesthetic:  Lidocaine 1% WITH epi Laceration details:    Location:  Scalp   Scalp location:  Frontal   Length (cm):  3 Repair type:    Repair type:  Simple Pre-procedure details:    Preparation:  Patient was prepped and draped in usual sterile fashion Exploration:    Wound exploration: wound explored through full range of motion and entire depth of wound probed and visualized     Contaminated: no   Treatment:    Area cleansed with:  Betadine and saline   Amount of cleaning:  Standard Skin repair:    Repair method:  Sutures   Suture size:  4-0   Suture material:  Plain gut   Number of sutures:  4 Approximation:    Approximation:  Close Post-procedure details:  Patient tolerance of procedure:  Tolerated well, no immediate complications     Medications Ordered in ED Medications  sodium chloride flush (NS) 0.9 % injection 3 mL (3 mLs Intravenous Not Given 06/10/19 0250)  lidocaine-EPINEPHrine (XYLOCAINE W/EPI) 2 %-1:200000 (PF) injection (has no administration in time range)  lidocaine-EPINEPHrine-tetracaine (LET) solution (3 mLs Topical Given 06/10/19 0250)  Tdap (BOOSTRIX) injection 0.5 mL (0.5 mLs Intramuscular Given 06/10/19 0251)     Initial Impression / Assessment and Plan / ED Course  I have reviewed the triage vital signs and the nursing notes.  Pertinent labs & imaging results that were available during my care of the patient were reviewed by me and considered in my medical decision making (see chart for details).        2:45 AM Patient presents after reported syncopal episode.  Given his alcohol level, this is the likely cause of his fall.  He did sustain a laceration that would be repaired, and he does have a nasal fracture.  Of note, patient has been seen by Dr. Erik Obey for previous nasal injury 4:15 AM Improved.  He tolerated wound repair well.  He ambulated. We will discharge home  Final Clinical Impressions(s) / ED Diagnoses   Final diagnoses:    Laceration of scalp without foreign body, initial encounter  Alcohol use  Dehydration  Closed fracture of nasal bone, initial encounter    ED Discharge Orders         Ordered    cephALEXin (KEFLEX) 500 MG capsule  2 times daily     06/10/19 0331           Ripley Fraise, MD 06/10/19 [phone]

## 2019-06-10 NOTE — ED Notes (Signed)
Pt was ambulated in the hall, tolerated well. No dizziness or SOB.

## 2019-06-10 NOTE — ED Notes (Signed)
ED Provider at bedside. 

## 2019-06-10 NOTE — ED Notes (Signed)
CBG Results of 95 reported to [place], Therapist, sports.

## 2019-06-10 NOTE — ED Notes (Signed)
Suture cart at bedside 

## 2019-06-15 DIAGNOSIS — S022XXA Fracture of nasal bones, initial encounter for closed fracture: Secondary | ICD-10-CM | POA: Diagnosis not present

## 2019-06-15 DIAGNOSIS — J310 Chronic rhinitis: Secondary | ICD-10-CM | POA: Diagnosis not present

## 2019-06-15 DIAGNOSIS — J342 Deviated nasal septum: Secondary | ICD-10-CM | POA: Diagnosis not present

## 2019-07-30 DIAGNOSIS — L570 Actinic keratosis: Secondary | ICD-10-CM | POA: Diagnosis not present

## 2019-07-30 DIAGNOSIS — L814 Other melanin hyperpigmentation: Secondary | ICD-10-CM | POA: Diagnosis not present

## 2019-07-30 DIAGNOSIS — Z85820 Personal history of malignant melanoma of skin: Secondary | ICD-10-CM | POA: Diagnosis not present

## 2019-07-30 DIAGNOSIS — D225 Melanocytic nevi of trunk: Secondary | ICD-10-CM | POA: Diagnosis not present

## 2019-07-30 DIAGNOSIS — D1801 Hemangioma of skin and subcutaneous tissue: Secondary | ICD-10-CM | POA: Diagnosis not present

## 2019-07-30 DIAGNOSIS — L821 Other seborrheic keratosis: Secondary | ICD-10-CM | POA: Diagnosis not present

## 2019-08-07 DIAGNOSIS — Z23 Encounter for immunization: Secondary | ICD-10-CM | POA: Diagnosis not present

## 2019-08-26 DIAGNOSIS — R42 Dizziness and giddiness: Secondary | ICD-10-CM | POA: Diagnosis not present

## 2019-08-26 DIAGNOSIS — E785 Hyperlipidemia, unspecified: Secondary | ICD-10-CM | POA: Diagnosis not present

## 2019-08-26 DIAGNOSIS — R55 Syncope and collapse: Secondary | ICD-10-CM | POA: Diagnosis not present

## 2019-08-26 DIAGNOSIS — M7021 Olecranon bursitis, right elbow: Secondary | ICD-10-CM | POA: Diagnosis not present

## 2019-08-26 DIAGNOSIS — D692 Other nonthrombocytopenic purpura: Secondary | ICD-10-CM | POA: Diagnosis not present

## 2019-08-26 DIAGNOSIS — J309 Allergic rhinitis, unspecified: Secondary | ICD-10-CM | POA: Diagnosis not present

## 2019-08-26 DIAGNOSIS — G250 Essential tremor: Secondary | ICD-10-CM | POA: Diagnosis not present

## 2019-08-26 DIAGNOSIS — I10 Essential (primary) hypertension: Secondary | ICD-10-CM | POA: Diagnosis not present

## 2019-08-26 DIAGNOSIS — H8112 Benign paroxysmal vertigo, left ear: Secondary | ICD-10-CM | POA: Diagnosis not present

## 2019-10-14 DIAGNOSIS — H524 Presbyopia: Secondary | ICD-10-CM | POA: Diagnosis not present

## 2019-10-14 DIAGNOSIS — H2513 Age-related nuclear cataract, bilateral: Secondary | ICD-10-CM | POA: Diagnosis not present

## 2019-10-14 DIAGNOSIS — H43813 Vitreous degeneration, bilateral: Secondary | ICD-10-CM | POA: Diagnosis not present

## 2019-10-14 DIAGNOSIS — H5203 Hypermetropia, bilateral: Secondary | ICD-10-CM | POA: Diagnosis not present

## 2022-03-26 ENCOUNTER — Encounter: Payer: Self-pay | Admitting: Internal Medicine
